# Patient Record
Sex: Female | Born: 1995
Health system: Southern US, Community
[De-identification: ages and names within clinical notes are randomized; demographics above are authoritative.]

## PROBLEM LIST (undated history)

## (undated) DIAGNOSIS — Z91010 Allergy to peanuts: Secondary | ICD-10-CM

## (undated) DIAGNOSIS — K219 Gastro-esophageal reflux disease without esophagitis: Secondary | ICD-10-CM

## (undated) DIAGNOSIS — L732 Hidradenitis suppurativa: Secondary | ICD-10-CM

## (undated) DIAGNOSIS — F419 Anxiety disorder, unspecified: Secondary | ICD-10-CM

## (undated) DIAGNOSIS — T7840XA Allergy, unspecified, initial encounter: Secondary | ICD-10-CM

## (undated) HISTORY — DX: Hidradenitis suppurativa: L73.2

## (undated) HISTORY — DX: Gastro-esophageal reflux disease without esophagitis: K21.9

## (undated) HISTORY — DX: Allergy, unspecified, initial encounter: T78.40XA

## (undated) HISTORY — DX: Allergy to peanuts: Z91.010

## (undated) HISTORY — DX: Anxiety disorder, unspecified: F41.9

---
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
## 2014-06-12 ENCOUNTER — Ambulatory Visit: Payer: Medicaid Other | Admitting: Pediatrics

## 2014-06-19 ENCOUNTER — Emergency Department (HOSPITAL_COMMUNITY)
Admission: EM | Admit: 2014-06-19 | Discharge: 2014-06-19 | Discharge status: Against Medical Advice | Payer: Medicaid Other | Attending: Emergency Medicine | Admitting: Emergency Medicine

## 2014-06-19 ENCOUNTER — Encounter (HOSPITAL_COMMUNITY): Payer: Self-pay | Admitting: Emergency Medicine

## 2014-06-19 DIAGNOSIS — R404 Transient alteration of awareness: Secondary | ICD-10-CM | POA: Insufficient documentation

## 2014-06-19 DIAGNOSIS — J45909 Unspecified asthma, uncomplicated: Secondary | ICD-10-CM | POA: Insufficient documentation

## 2014-06-19 NOTE — ED Notes (Signed)
Pt refused blood draw.  Triage tech will speak with triage nurse pertaining to pt's concern.

## 2014-06-19 NOTE — ED Notes (Signed)
Pt's family came to the desk looking for pt's discharge papers after speaking to pt informing her that she cannot be discharged until the EDP have evaluated her.  She states that she will take pt somewhere else.  Pt and family left the ED.

## 2014-06-19 NOTE — ED Notes (Signed)
Pt states she was at work and she passed out  Pt states she does not remember passing out but woke up on the ground  Pt states she has not eaten since 6am today  Pt states she used to do this when she was in high school but hasn't since then  Pt is not diabetic

## 2014-08-14 ENCOUNTER — Encounter: Payer: Self-pay | Admitting: Pediatrics

## 2014-08-14 ENCOUNTER — Ambulatory Visit (INDEPENDENT_AMBULATORY_CARE_PROVIDER_SITE_OTHER): Payer: Medicaid Other | Admitting: Pediatrics

## 2014-08-14 VITALS — BP 120/68 | Temp 99.4°F | Wt 166.6 lb

## 2014-08-14 DIAGNOSIS — Z3202 Encounter for pregnancy test, result negative: Secondary | ICD-10-CM

## 2014-08-14 DIAGNOSIS — Z23 Encounter for immunization: Secondary | ICD-10-CM

## 2014-08-14 DIAGNOSIS — Z3201 Encounter for pregnancy test, result positive: Secondary | ICD-10-CM

## 2014-08-14 DIAGNOSIS — N912 Amenorrhea, unspecified: Secondary | ICD-10-CM

## 2014-08-14 LAB — POCT URINE PREGNANCY: Preg Test, Ur: NEGATIVE

## 2014-08-14 NOTE — Progress Notes (Signed)
Patient states she has not had menstrual cycle since the beginning of September. She states she has had vomiting 2 times, she states that she took two clear blue that came out negative and 2 [employer] pregnancy tests that came back positive. She states she alternated within the past two weeks which test she performed. Patient states it has been a year since she had birthcontrol, she stopped it June/July of 2014.

## 2014-08-14 NOTE — Progress Notes (Signed)
Subjective:     Patient ID: Kerri Gomez, female   DOB: [birth year], 18 y.o.   MRN: [id number]  HPI     Kerri Gomez is here with a concern of pregnancy.  She reports her last menstrual period was some time the end of August or first of September.  She feels she has missed a period or two.  She last had sex sometime in August or September.  She has been nauseated off and on.  Last time she vomited was some time about two weeks ago.  She has done 4 pregnancy tests over the last two weeks with the first two from [facility] being positive and then two from a drugstore being negative.  Her breasts are not swollen. She is having no dysuria, abdominal pain, no vaginal discharge,  no cramping, and no vaginal bleeding since her last period some time towards the end of the summer. She uses no method of birth control.  Kerri Gomez is in the room by herself but reports she has discussed the possibility of pregnancy with her mother who is in the waiting room.  After the exam, explained to Kerri Gomez that the urine POC pregnancy test today was negative but with her amenorrhea we should proceed with a serum HCG as well as routine HIV, GC, and Chlamydia screening.  She has refused blood draws in the ED and left the ED on 06/19/14 without completing her evaluation for having passed out at work.  She reports that she does not want any blood drawn today so provider requested that she call her mother on her cell phone to come to the room.  Advised that blood draw and immunizations are highly recommended today.   Review of Systems  Constitutional: Negative for fever, chills, activity change and appetite change.  HENT: Negative for congestion, rhinorrhea and sore throat.   Respiratory: Negative for cough.   Gastrointestinal: Positive for vomiting. Negative for diarrhea and constipation.  Genitourinary: Negative for dysuria and frequency.  Neurological: Negative for light-headedness (no recent  episodes of fainting since seen in the ED 06/19/14).       Objective:   Physical Exam  Constitutional: She appears well-developed and well-nourished. No distress.  HENT:  Mouth/Throat: No oropharyngeal exudate.  Eyes: Conjunctivae are normal. Right eye exhibits no discharge. Left eye exhibits no discharge.  Neck: Neck supple. No thyromegaly present.  Cardiovascular: Normal rate.   No murmur heard. Pulmonary/Chest: Effort normal and breath sounds normal.  Abdominal: Soft. She exhibits no mass. There is no tenderness. There is no rebound and no guarding.  Belly appears a little full in the suprapubic area but cannot palpate enlarged uterus.   Musculoskeletal: She exhibits no edema.  Skin: No rash noted.       Assessment and Plan:   1. Amenorrhea  - POCT urine pregnancy negative in clinic today  2. Need for vaccination - left without getting vaccines  3. Pregnancy test negative - left without getting labs - HIV antibody - GC/chlamydia probe amp, urine - hCG, serum, qualitative  4. Pregnancy test positive by history - left without getting labs - HIV antibody - GC/chlamydia probe amp, urine - hCG, serum, qualitative  Teen seen leaving exam room, followed and caught up with her getting on the elevator with her mother.  Requested to return to the clinic for labs and but mother says, "everyone in this clinic is rude and my child does not have to have blood drawn if  she does not want to!"  Will proceed with testing urine for GC and chlamydia.  Shea EvansMelinda Coover Nashawn Hillock, MD [facility] for [facility], [address] [place], [place] [address] [phone]

## 2014-08-15 LAB — GC/CHLAMYDIA PROBE AMP, URINE
CHLAMYDIA, SWAB/URINE, PCR: NEGATIVE
GC PROBE AMP, URINE: NEGATIVE

## 2014-08-16 ENCOUNTER — Telehealth: Payer: Self-pay | Admitting: *Deleted

## 2014-08-16 LAB — HCG, SERUM, QUALITATIVE

## 2014-08-16 LAB — HIV ANTIBODY (ROUTINE TESTING W REFLEX)

## 2014-08-16 NOTE — Telephone Encounter (Signed)
Tonya from [facility] called to see of blood was drawn for patient, i told her that i would have Chasitie return her call

## 2014-09-12 ENCOUNTER — Telehealth: Payer: Self-pay | Admitting: *Deleted

## 2014-09-12 NOTE — Telephone Encounter (Signed)
Kerri Gomez called requesting to have a UPT done her at the office, she has never been seen here. I called and explained to her that UPT can be bought OTC, she tool 2 of them and both negative. She said no cylce in November, I explained to her that we can see as a new patient,but she cant just have a UPT done here. She said okay and hung up the phone.

## 2014-09-13 ENCOUNTER — Encounter (HOSPITAL_COMMUNITY): Payer: Self-pay | Admitting: *Deleted

## 2014-09-13 ENCOUNTER — Emergency Department (INDEPENDENT_AMBULATORY_CARE_PROVIDER_SITE_OTHER)
Admission: EM | Admit: 2014-09-13 | Discharge: 2014-09-13 | Disposition: A | Discharge status: Home / Self Care | Payer: Medicaid Other | Source: Home / Self Care | Attending: Emergency Medicine | Admitting: Emergency Medicine

## 2014-09-13 DIAGNOSIS — N926 Irregular menstruation, unspecified: Secondary | ICD-10-CM

## 2014-09-13 LAB — POCT URINALYSIS DIP (DEVICE)
BILIRUBIN URINE: NEGATIVE
GLUCOSE, UA: NEGATIVE mg/dL
KETONES UR: NEGATIVE mg/dL
NITRITE: NEGATIVE
Protein, ur: 30 mg/dL — AB
Specific Gravity, Urine: >=1.03 (ref 1.005–1.030)
Urobilinogen, UA: 0.2 mg/dL (ref 0.0–1.0)
pH: 5.5 (ref 5.0–8.0)

## 2014-09-13 LAB — HCG, QUANTITATIVE, PREGNANCY

## 2014-09-13 LAB — POCT PREGNANCY, URINE: PREG TEST UR: NEGATIVE

## 2014-09-13 NOTE — Discharge Instructions (Signed)
Abnormal Uterine Bleeding Abnormal uterine bleeding can affect women at various stages in life, including teenagers, women in their reproductive years, pregnant women, and women who have reached menopause. Several kinds of uterine bleeding are considered abnormal, including:  Bleeding or spotting between periods.   Bleeding after sexual intercourse.   Bleeding that is heavier or more than normal.   Periods that last longer than usual.  Bleeding after menopause.  Many cases of abnormal uterine bleeding are minor and simple to treat, while others are more serious. Any type of abnormal bleeding should be evaluated by your health care provider. Treatment will depend on the cause of the bleeding. HOME CARE INSTRUCTIONS Monitor your condition for any changes. The following actions may help to alleviate any discomfort you are experiencing:  Avoid the use of tampons and douches as directed by your health care provider.  Change your pads frequently. You should get regular pelvic exams and Pap tests. Keep all follow-up appointments for diagnostic tests as directed by your health care provider.  SEEK MEDICAL CARE IF:   Your bleeding lasts more than 1 week.   You feel dizzy at times.  SEEK IMMEDIATE MEDICAL CARE IF:   You pass out.   You are changing pads every 15 to 30 minutes.   You have abdominal pain.  You have a fever.   You become sweaty or weak.   You are passing large blood clots from the vagina.   You start to feel nauseous and vomit. MAKE SURE YOU:   Understand these instructions.  Will watch your condition.  Will get help right away if you are not doing well or get worse. Document Released: 09/29/2005 Document Revised: 10/04/2013 Document Reviewed: 04/28/2013 ExitCare Patient Information 2015 [employer]. This information is not intended to replace advice given to you by your health care provider. Make sure you discuss any questions you have with your  health care provider.  

## 2014-09-13 NOTE — ED Notes (Signed)
Last normal  menstural  Period        approx  2  Months  Ago   - has  Had   Some  Spotting  As    Well    Spotted  Some  Several  Days  Ago        pos  Home  preg test      - ambulated  Well  With a  Steady  Fluid  Gait

## 2014-09-13 NOTE — ED Provider Notes (Signed)
CSN: [id number]     Arrival date & time 09/13/14  1618 History   First MD Initiated Contact with Patient 09/13/14 1636     Chief Complaint  Patient presents with  . Possible Pregnancy   (Consider location/radiation/quality/duration/timing/severity/associated sxs/prior Treatment) HPI     18 year old female presents requesting a pregnancy test. She is currently trying to get pregnant. She did a pregnancy test at home that was +2 weeks ago, she did 10 other pregnancy tests that were negative. She denies any abdominal pain, vaginal bleeding, nausea, vomiting, fever, chills. She has had spotting for the past year.  Past Medical History  Diagnosis Date  . Asthma   . Hidradenitis suppurativa   . Peanut allergy    History reviewed. No pertinent past surgical history. Family History  Problem Relation Age of Onset  . Diabetes Mother   . Heart disease Mother     hole in her heart  . Diabetes Maternal Grandmother   . Diabetes Maternal Grandfather    History  Substance Use Topics  . Smoking status: Never Smoker   . Smokeless tobacco: Never Used  . Alcohol Use: No   OB History    No data available     Review of Systems  Gastrointestinal: Negative for nausea, vomiting and abdominal pain.  Genitourinary: Positive for vaginal bleeding (spotting).  All other systems reviewed and are negative.   Allergies  Peanut-containing drug products and Latex  Home Medications   Prior to Admission medications   Medication Sig Start Date End Date Taking? Authorizing Provider  acetaminophen (TYLENOL) 500 MG tablet Take 1,000 mg by mouth every 6 (six) hours as needed for pain.    Historical Provider, MD  albuterol (PROVENTIL HFA;VENTOLIN HFA) 108 (90 BASE) MCG/ACT inhaler Inhale 2 puffs into the lungs every 4 (four) hours as needed for wheezing or shortness of breath.     Historical Provider, MD  clindamycin (CLEOCIN-T) 1 % external solution Apply topically 2 (two) times daily. 09/30/13   Heber CarolinaKate S  Ettefagh, MD  diphenhydrAMINE (BENADRYL) 25 mg capsule Take 25 mg by mouth every 6 (six) hours as needed for itching or allergies.    Historical Provider, MD  EPINEPHrine (EPIPEN) 0.3 mg/0.3 mL SOAJ injection Inject 0.3 mLs (0.3 mg total) into the muscle once. As needed for anaphylaxis.  Call 911 or go directly to the ER if used. 09/30/13   Heber CarolinaKate S Ettefagh, MD   BP 131/85 mmHg  Pulse 90  Temp(Src) 98.7 F (37.1 C) (Oral)  Resp 16  SpO2 94%  LMP 07/13/2014 (Approximate) Physical Exam  Constitutional: She is oriented to person, place, and time. Vital signs are normal. She appears well-developed and well-nourished. No distress.  HENT:  Head: Normocephalic and atraumatic.  Cardiovascular: Normal rate, regular rhythm and normal heart sounds.   Pulmonary/Chest: Effort normal and breath sounds normal. No respiratory distress.  Abdominal: Soft. She exhibits distension (Mildly distended. No palpable uterus). She exhibits no mass. There is no tenderness. There is no rebound and no guarding.  Neurological: She is alert and oriented to person, place, and time. She has normal strength. Coordination normal.  Skin: Skin is warm and dry. No rash noted. She is not diaphoretic.  Psychiatric: She has a normal mood and affect. Judgment normal.  Nursing note and vitals reviewed.   ED Course  Procedures (including critical care time) Labs Review Labs Reviewed  POCT URINALYSIS DIP (DEVICE) - Abnormal; Notable for the following:    Hgb urine dipstick LARGE (*)  Protein, ur 30 (*)    Leukocytes, UA TRACE (*)    All other components within normal limits  POCT PREGNANCY, URINE    Imaging Review No results found.   MDM   1. Irregular periods    HCG Quant sent to confirm negative pregnancy. She confirms no abdominal pain or vaginal bleeding right now, she just wants to have a urine pregnancy test. Follow-up when necessary       Graylon GoodZachary H Shadavia Dampier, PA-C 09/13/14 1702

## 2015-05-06 ENCOUNTER — Emergency Department (HOSPITAL_COMMUNITY)
Admission: EM | Admit: 2015-05-06 | Discharge: 2015-05-06 | Disposition: A | Discharge status: Home / Self Care | Payer: Medicaid Other | Attending: Emergency Medicine | Admitting: Emergency Medicine

## 2015-05-06 ENCOUNTER — Encounter (HOSPITAL_COMMUNITY): Payer: Self-pay | Admitting: *Deleted

## 2015-05-06 DIAGNOSIS — Z872 Personal history of diseases of the skin and subcutaneous tissue: Secondary | ICD-10-CM | POA: Diagnosis not present

## 2015-05-06 DIAGNOSIS — Z792 Long term (current) use of antibiotics: Secondary | ICD-10-CM | POA: Insufficient documentation

## 2015-05-06 DIAGNOSIS — R55 Syncope and collapse: Secondary | ICD-10-CM | POA: Insufficient documentation

## 2015-05-06 DIAGNOSIS — Z91040 Latex allergy status: Secondary | ICD-10-CM | POA: Insufficient documentation

## 2015-05-06 DIAGNOSIS — J45909 Unspecified asthma, uncomplicated: Secondary | ICD-10-CM | POA: Diagnosis not present

## 2015-05-06 DIAGNOSIS — Z3202 Encounter for pregnancy test, result negative: Secondary | ICD-10-CM | POA: Diagnosis not present

## 2015-05-06 DIAGNOSIS — Z79899 Other long term (current) drug therapy: Secondary | ICD-10-CM | POA: Insufficient documentation

## 2015-05-06 LAB — URINALYSIS, ROUTINE W REFLEX MICROSCOPIC
Bilirubin Urine: NEGATIVE
Glucose, UA: NEGATIVE mg/dL
Hgb urine dipstick: NEGATIVE
KETONES UR: NEGATIVE mg/dL
Nitrite: NEGATIVE
PH: 5.5 (ref 5.0–8.0)
Protein, ur: NEGATIVE mg/dL
SPECIFIC GRAVITY, URINE: 1.028 (ref 1.005–1.030)
Urobilinogen, UA: 0.2 mg/dL (ref 0.0–1.0)

## 2015-05-06 LAB — URINE MICROSCOPIC-ADD ON

## 2015-05-06 LAB — CBG MONITORING, ED: GLUCOSE-CAPILLARY: 69 mg/dL (ref 65–99)

## 2015-05-06 LAB — POC URINE PREG, ED: Preg Test, Ur: NEGATIVE

## 2015-05-06 MED ORDER — ACETAMINOPHEN 325 MG PO TABS
650.0000 mg | ORAL_TABLET | Freq: Once | ORAL | Status: AC
  Administered 2015-05-06: 650 mg via ORAL
  Filled 2015-05-06: qty 2
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
## 2015-05-06 NOTE — ED Notes (Signed)
Made nurse aware
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
## 2015-05-06 NOTE — ED Notes (Signed)
Patient is refusing labs and IV, MD notified.

## 2015-05-06 NOTE — ED Notes (Signed)
Patient refused to have blood collect 

## 2015-08-29 ENCOUNTER — Emergency Department (HOSPITAL_COMMUNITY)
Admission: EM | Admit: 2015-08-29 | Discharge: 2015-08-29 | Disposition: A | Discharge status: Home / Self Care | Payer: No Typology Code available for payment source | Attending: Emergency Medicine | Admitting: Emergency Medicine

## 2015-08-29 ENCOUNTER — Encounter (HOSPITAL_COMMUNITY): Payer: Self-pay | Admitting: Emergency Medicine

## 2015-08-29 ENCOUNTER — Emergency Department (HOSPITAL_COMMUNITY): Payer: No Typology Code available for payment source

## 2015-08-29 DIAGNOSIS — S29001A Unspecified injury of muscle and tendon of front wall of thorax, initial encounter: Secondary | ICD-10-CM | POA: Diagnosis not present

## 2015-08-29 DIAGNOSIS — Z91040 Latex allergy status: Secondary | ICD-10-CM | POA: Insufficient documentation

## 2015-08-29 DIAGNOSIS — Z872 Personal history of diseases of the skin and subcutaneous tissue: Secondary | ICD-10-CM | POA: Diagnosis not present

## 2015-08-29 DIAGNOSIS — S299XXA Unspecified injury of thorax, initial encounter: Secondary | ICD-10-CM | POA: Diagnosis present

## 2015-08-29 DIAGNOSIS — Y92410 Unspecified street and highway as the place of occurrence of the external cause: Secondary | ICD-10-CM | POA: Insufficient documentation

## 2015-08-29 DIAGNOSIS — Y9389 Activity, other specified: Secondary | ICD-10-CM | POA: Diagnosis not present

## 2015-08-29 DIAGNOSIS — Z79899 Other long term (current) drug therapy: Secondary | ICD-10-CM | POA: Insufficient documentation

## 2015-08-29 DIAGNOSIS — Y998 Other external cause status: Secondary | ICD-10-CM | POA: Diagnosis not present

## 2015-08-29 DIAGNOSIS — J45909 Unspecified asthma, uncomplicated: Secondary | ICD-10-CM | POA: Insufficient documentation

## 2015-08-29 DIAGNOSIS — R0789 Other chest pain: Secondary | ICD-10-CM

## 2015-08-29 MED ORDER — METHOCARBAMOL 500 MG PO TABS: 500.0000 mg | ORAL_TABLET | Freq: Two times a day (BID) | ORAL | Status: DC

## 2015-08-29 MED ORDER — HYDROCODONE-ACETAMINOPHEN 5-325 MG PO TABS
2.0000 | ORAL_TABLET | Freq: Once | ORAL | Status: AC
  Administered 2015-08-29: 2 via ORAL
  Filled 2015-08-29: qty 2

## 2015-08-29 MED ORDER — IBUPROFEN 400 MG PO TABS: 400.0000 mg | ORAL_TABLET | Freq: Four times a day (QID) | ORAL | Status: DC | PRN

## 2015-08-29 MED ORDER — ONDANSETRON 4 MG PO TBDP: 4.0000 mg | ORAL_TABLET | Freq: Once | ORAL | Status: DC
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
## 2015-08-29 NOTE — ED Notes (Signed)
Acuity level changed to 3 at PA request.
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
## 2015-12-05 ENCOUNTER — Ambulatory Visit: Payer: Medicaid Other | Admitting: Allergy and Immunology

## 2015-12-06 ENCOUNTER — Ambulatory Visit (INDEPENDENT_AMBULATORY_CARE_PROVIDER_SITE_OTHER): Payer: Medicaid Other | Admitting: Allergy and Immunology

## 2015-12-06 VITALS — BP 104/64 | HR 107 | Temp 98.1°F | Resp 20 | Ht 66.14 in | Wt 195.7 lb

## 2015-12-06 DIAGNOSIS — H1010 Acute atopic conjunctivitis, unspecified eye: Secondary | ICD-10-CM | POA: Diagnosis not present

## 2015-12-06 DIAGNOSIS — R05 Cough: Secondary | ICD-10-CM | POA: Diagnosis not present

## 2015-12-06 DIAGNOSIS — J309 Allergic rhinitis, unspecified: Secondary | ICD-10-CM

## 2015-12-06 DIAGNOSIS — R059 Cough, unspecified: Secondary | ICD-10-CM

## 2015-12-06 MED ORDER — LORATADINE 10 MG PO TABS: 10.0000 mg | ORAL_TABLET | Freq: Every day | ORAL | Status: DC

## 2015-12-06 MED ORDER — OLOPATADINE HCL 0.2 % OP SOLN: 1.0000 [drp] | OPHTHALMIC | Status: DC

## 2015-12-06 MED ORDER — FLUTICASONE PROPIONATE 50 MCG/ACT NA SUSP: NASAL | Status: DC

## 2015-12-06 MED ORDER — ALBUTEROL SULFATE HFA 108 (90 BASE) MCG/ACT IN AERS: 2.0000 | INHALATION_SPRAY | RESPIRATORY_TRACT | Status: DC | PRN
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
## 2015-12-06 NOTE — Patient Instructions (Signed)
   Flonase one-two sprays once each nostril once daily.  Claritin  once daily.  Epi-pen/benadryl as needed.  ProAir 2 puffs every 4 hours as needed.  Call with any recurring ProAir use.  Pataday one drop each eye once daily.  Keep well hydrated and avoid irritants/cig smoke/strong perfumes.  Schedule for repeat allergy testing appointment for peanut otherwise follow-up in 6 months or sooner if needed.

## 2015-12-27 ENCOUNTER — Encounter: Payer: Self-pay | Admitting: Allergy and Immunology

## 2015-12-27 ENCOUNTER — Ambulatory Visit (INDEPENDENT_AMBULATORY_CARE_PROVIDER_SITE_OTHER): Payer: Medicaid Other | Admitting: Allergy and Immunology

## 2015-12-27 VITALS — BP 122/80 | HR 76 | Temp 98.1°F | Resp 16

## 2015-12-27 DIAGNOSIS — J309 Allergic rhinitis, unspecified: Secondary | ICD-10-CM | POA: Diagnosis not present

## 2015-12-27 DIAGNOSIS — R05 Cough: Secondary | ICD-10-CM

## 2015-12-27 DIAGNOSIS — R059 Cough, unspecified: Secondary | ICD-10-CM

## 2015-12-27 DIAGNOSIS — Z91010 Allergy to peanuts: Secondary | ICD-10-CM | POA: Diagnosis not present

## 2015-12-27 DIAGNOSIS — H1010 Acute atopic conjunctivitis, unspecified eye: Secondary | ICD-10-CM | POA: Diagnosis not present

## 2015-12-27 MED ORDER — LEVOCETIRIZINE DIHYDROCHLORIDE 5 MG PO TABS: ORAL_TABLET | ORAL | Status: DC

## 2015-12-27 MED ORDER — MONTELUKAST SODIUM 10 MG PO TABS: ORAL_TABLET | ORAL | Status: DC

## 2015-12-27 MED ORDER — FLUTICASONE PROPIONATE 50 MCG/ACT NA SUSP: NASAL | Status: DC

## 2015-12-27 MED ORDER — EPINEPHRINE 0.3 MG/0.3ML IJ SOAJ: INTRAMUSCULAR | Status: DC

## 2015-12-27 MED ORDER — ALBUTEROL SULFATE HFA 108 (90 BASE) MCG/ACT IN AERS: INHALATION_SPRAY | RESPIRATORY_TRACT | Status: DC

## 2015-12-27 NOTE — Progress Notes (Signed)
FOLLOW UP NOTE  RE: Kerri RocaZibria S Degraaf MRN: [id number] DOB: 03/09/1996 [facility] [place] [address] [place] [id number] Date of Office Visit: 12/27/2015  Subjective:  Kerri Gomez is a 20 y.o. female who presents today for Allergy Testing and Medication Management  Assessment:   1. Allergic rhinoconjunctivitis.   2. Cough, currently asymptomatic.   3. Question of activity-induced dyspnea due to de-conditioning versus bronchospasm.   4.      Peanut/Tree nut allergy--decreased skin test hyperreactivity.  Suspected lessening hypersensitivity. Plan:   Meds ordered this encounter  Medications  . levocetirizine (XYZAL) 5 MG tablet    Sig: Take one tablet daily for runny nose or itching.    Dispense:  34 tablet    Refill:  5  . montelukast (SINGULAIR) 10 MG tablet    Sig: Take one tablet each evening to prevent cough or wheeze.    Dispense:  34 tablet    Refill:  5  . fluticasone (FLONASE) 50 MCG/ACT nasal spray    Sig: Use 1-2 sprays in each nostril once daily for stuffy nose or drainage    Dispense:  16 g    Refill:  5  . albuterol (PROVENTIL HFA;VENTOLIN HFA) 108 (90 Base) MCG/ACT inhaler    Sig: Use 2 puffs every 4 hours as needed for cough or wheeze.  May use 2 puffs 10-20 minutes prior to exercise.    Dispense:  1 Inhaler    Refill:  1  . EPINEPHrine 0.3 mg/0.3 mL IJ SOAJ injection    Sig: Use as directed for a severe allergic reaction.    Dispense:  4 Device    Refill:  2   Patient Instructions  1.  Begin trial Xyzal 5 mg daily and stop Claritin. 2.  Begin Singulair 10 mg each evening. 3.  Flonase 1-2 sprays each nostril once daily as needed for congestion and continue Pataday once daily as needed. 4.  Consider allergy injections--- written information given today. 5.  Allergen avoidance Information given and reviewed today. 6.  EpiPen, Benadryl as needed. 7.  [facility]--- paperwork given for nut  panel specific IgE. 8.  Consider in office peanut butter challenge. 9.  Ventolin HFA as needed and monitor closely for recurring use.  And assess need for further evaluation or management. 10. Return to primary care physician for full physical and establish activity/exercise program.   HPI: Kerri Gomez returns to the office with her mother off antihistamines for selected repeat allergen testing, given her peanut/nut allergy history.  Generally she has been feeling well since her February visit, without cough, wheeze, shortness of breath, difficulty breathing, new medical concerns itching or rashes.  She did use her albuterol once while at work for shortness of breath , without cough or wheeze and did not think it was helpful, but did not have persisting symptoms when she relaxed.  No other concerns. Denies ED or urgent care visits, prednisone or antibiotic courses. Reports sleep and activity are normal.  Kerri Gomez has a current medication list which includes the following prescription(s): albuterol, diphenhydramine, epinephrine, fluticasone, ibuprofen, loratadine, olopatadine opth hcl.   Drug Allergies: Allergies  Allergen Reactions  . Lactose Intolerance (Gi) Nausea And Vomiting  . Peanut-Containing Drug Products   . Latex Rash   Objective:   Filed Vitals:   12/27/15 1524  BP: 122/80  Pulse: 76  Temp: 98.1 F (36.7 C)  Resp: 16   Physical Exam  Constitutional:  She is well-developed, well-nourished, and in no distress.  HENT:  Head: Atraumatic.  Right Ear: Tympanic membrane and ear canal normal.  Left Ear: Tympanic membrane and ear canal normal.  Nose: Mucosal edema present. No rhinorrhea. No epistaxis.  Mouth/Throat: Oropharynx is clear and moist and mucous membranes are normal. No oropharyngeal exudate, posterior oropharyngeal edema or posterior oropharyngeal erythema.  Neck: Neck supple.  Cardiovascular: Normal rate, S1 normal and S2 normal.   No murmur heard. Pulmonary/Chest: Effort  normal. She has no wheezes. She has no rhonchi. She has no rales.  Lymphadenopathy:    She has no cervical adenopathy.   Diagnostics: Spirometry:  FVC 3.48--105%, FEV1 2.99---105%. Skin test:  Mild reactivity to selected mold species, hazelnut and coconut with equivocal reactivity to peanut, pecan, walnut, almond and [place] nut.  (In Review 2013 testing= peanut--2+, almond/hazelnut--3+ and very strong reactivity to grass, weed and tree pollens, dust mite and cat).    Kerri Gomez M. Willa Rough, MD  cc: Pola Corn, MD

## 2015-12-27 NOTE — Patient Instructions (Addendum)
   Begin trial Xyzal 5 mg daily.  Begin Singulair 10 mg each evening.  Flonase 1-2 sprays each nostril once daily as needed for congestion.  Consider allergy injections--- written information given today.  Allergen avoidance Information.  EpiPen, Benadryl as needed.  [facility].  Consider in office peanut butter challenge.  Ventolin HFA as needed.

## 2016-01-17 ENCOUNTER — Encounter: Payer: Medicaid Other | Admitting: Allergy and Immunology

## 2016-01-21 ENCOUNTER — Telehealth: Payer: Self-pay | Admitting: Allergy and Immunology

## 2016-01-21 ENCOUNTER — Other Ambulatory Visit: Payer: Self-pay

## 2016-01-21 MED ORDER — ALBUTEROL SULFATE (2.5 MG/3ML) 0.083% IN NEBU: 2.5000 mg | INHALATION_SOLUTION | RESPIRATORY_TRACT | Status: DC | PRN

## 2016-01-21 NOTE — Telephone Encounter (Signed)
Sent in refill

## 2016-01-21 NOTE — Telephone Encounter (Signed)
Pt went to [facility] on [address] to pick up rx and they are not there and she needs the albuterol for the nebulizer.

## 2016-07-07 ENCOUNTER — Telehealth: Payer: Self-pay | Admitting: Allergy and Immunology

## 2016-07-07 NOTE — Telephone Encounter (Signed)
She needs to know if it was a flu shot or a TB test that she had done earlier this year. She needs this information for her job today if at all possible.

## 2016-07-07 NOTE — Telephone Encounter (Signed)
I do not see where any injections have been given. I tried contacting pt but number was not working will try again number later.

## 2016-09-05 ENCOUNTER — Encounter (HOSPITAL_COMMUNITY): Payer: Self-pay | Admitting: Emergency Medicine

## 2016-09-05 ENCOUNTER — Emergency Department (HOSPITAL_COMMUNITY)
Admission: EM | Admit: 2016-09-05 | Discharge: 2016-09-05 | Disposition: A | Discharge status: Home / Self Care | Payer: Medicaid Other | Attending: Emergency Medicine | Admitting: Emergency Medicine

## 2016-09-05 DIAGNOSIS — Y939 Activity, unspecified: Secondary | ICD-10-CM | POA: Insufficient documentation

## 2016-09-05 DIAGNOSIS — Z91040 Latex allergy status: Secondary | ICD-10-CM | POA: Insufficient documentation

## 2016-09-05 DIAGNOSIS — T2101XA Burn of unspecified degree of chest wall, initial encounter: Secondary | ICD-10-CM | POA: Diagnosis not present

## 2016-09-05 DIAGNOSIS — Y929 Unspecified place or not applicable: Secondary | ICD-10-CM | POA: Diagnosis not present

## 2016-09-05 DIAGNOSIS — F17290 Nicotine dependence, other tobacco product, uncomplicated: Secondary | ICD-10-CM | POA: Insufficient documentation

## 2016-09-05 DIAGNOSIS — T300 Burn of unspecified body region, unspecified degree: Secondary | ICD-10-CM

## 2016-09-05 DIAGNOSIS — X118XXA Contact with other hot tap-water, initial encounter: Secondary | ICD-10-CM | POA: Insufficient documentation

## 2016-09-05 DIAGNOSIS — Y990 Civilian activity done for income or pay: Secondary | ICD-10-CM | POA: Diagnosis not present

## 2016-09-05 DIAGNOSIS — Z91010 Allergy to peanuts: Secondary | ICD-10-CM | POA: Diagnosis not present

## 2016-09-05 DIAGNOSIS — J45909 Unspecified asthma, uncomplicated: Secondary | ICD-10-CM | POA: Diagnosis not present

## 2016-09-05 NOTE — ED Triage Notes (Signed)
Pt at work with complaint of burn to left nipple. Burn as result of washing dishes and container with water approx 180 degrees F made contact with complaint area.

## 2016-09-05 NOTE — Discharge Instructions (Signed)
Please read attached information. If you experience any new or worsening signs or symptoms please return to the emergency room for evaluation. Please follow-up with your primary care provider or specialist as discussed.  °

## 2016-09-05 NOTE — ED Provider Notes (Signed)
[facility]-EMERGENCY DEPT Provider Note   CSN: [id number] Arrival date & time: 09/05/16  1441  By signing my name below, I, Soijett Blue, attest that this documentation has been prepared under the direction and in the presence of Burna FortsJeff Raymie Trani, PA-C Electronically Signed: Soijett Blue, ED Scribe. 09/05/16. 3:35 PM.   History   Chief Complaint Chief Complaint  Patient presents with  . Burn    HPI  Kerri Gomez is a 20 y.o. female who presents to the Emergency Department complaining of burn onset 1 hour ago PTA. Pt reports that she was washing dishes while at work when the hot water that was in a container splashed onto her left sided chest and nipple area when she went to move the container. She states that she is having associated symptoms of redness to left sided chest and nipple area. Pt hasn't tried any medications or cool compresses for the relief of her symptoms.  She denies fever, chills, drainage, color change, and any other symptoms.    The history is provided by the patient. No language interpreter was used.    Past Medical History:  Diagnosis Date  . Asthma   . Hidradenitis suppurativa   . Peanut allergy     Patient Active Problem List   Diagnosis Date Noted  . Peanut allergy 09/30/2013  . Hidradenitis suppurativa 09/30/2013    History reviewed. No pertinent surgical history.  OB History    No data available       Home Medications    Prior to Admission medications   Medication Sig Start Date End Date Taking? Authorizing Provider  albuterol (PROVENTIL HFA;VENTOLIN HFA) 108 (90 Base) MCG/ACT inhaler Use 2 puffs every 4 hours as needed for cough or wheeze.  May use 2 puffs 10-20 minutes prior to exercise. 12/27/15   Roselyn Kara MeadM Hicks, MD  albuterol (PROVENTIL) (2.5 MG/3ML) 0.083% nebulizer solution Take 3 mLs (2.5 mg total) by nebulization every 4 (four) hours as needed for wheezing or shortness of breath. 01/21/16   Roselyn Kara MeadM Hicks, MD  clindamycin (CLEOCIN-T) 1 %  external solution Apply topically 2 (two) times daily. Patient not taking: Reported on 05/06/2015 09/30/13   Voncille LoKate Ettefagh, MD  diphenhydrAMINE (BENADRYL) 25 mg capsule Take 25 mg by mouth every 6 (six) hours as needed for itching or allergies.    Historical Provider, MD  EPINEPHrine 0.3 mg/0.3 mL IJ SOAJ injection Use as directed for a severe allergic reaction. 12/27/15   Roselyn Kara MeadM Hicks, MD  fluticasone (FLONASE) 50 MCG/ACT nasal spray Use 1-2 sprays in each nostril once daily for stuffy nose or drainage 12/27/15   Roselyn Kara MeadM Hicks, MD  ibuprofen (ADVIL,MOTRIN) 400 MG tablet Take 1 tablet (400 mg total) by mouth every 6 (six) hours as needed. 08/29/15   Joycie PeekBenjamin Cartner, PA-C  levocetirizine (XYZAL) 5 MG tablet Take one tablet daily for runny nose or itching. 12/27/15   Roselyn Kara MeadM Hicks, MD  loratadine (CLARITIN) 10 MG tablet Take 1 tablet (10 mg total) by mouth daily. 12/06/15   Roselyn Kara MeadM Hicks, MD  methocarbamol (ROBAXIN) 500 MG tablet Take 1 tablet (500 mg total) by mouth 2 (two) times daily. Patient not taking: Reported on 12/27/2015 08/29/15   Joycie PeekBenjamin Cartner, PA-C  montelukast (SINGULAIR) 10 MG tablet Take one tablet each evening to prevent cough or wheeze. 12/27/15   Roselyn Kara MeadM Hicks, MD  Olopatadine HCl (PATADAY) 0.2 % SOLN Place 1 drop into both eyes 1 day or 1 dose. 12/06/15   Roselyn Kara MeadM Hicks,  MD    Family History Family History  Problem Relation Age of Onset  . Diabetes Mother   . Heart disease Mother     hole in her heart  . Diabetes Maternal Grandmother   . Diabetes Maternal Grandfather     Social History Social History  Substance Use Topics  . Smoking status: Current Some Day Smoker    Types: Cigars  . Smokeless tobacco: Never Used  . Alcohol use No     Allergies   Lactose intolerance (gi); Peanut-containing drug products; and Latex   Review of Systems Review of Systems  Constitutional: Negative for chills and fever.  Skin: Positive for color change (redness to left  sided chest and nipple area). Negative for wound.       Burn to left sided chest and nipple without drainage     Physical Exam Updated Vital Signs BP 124/71 (BP Location: Left Arm)   Pulse 83   Temp 98 F (36.7 C) (Oral)   Resp 17   LMP 07/28/2016   SpO2 100%   Physical Exam  Constitutional: She is oriented to person, place, and time. She appears well-developed and well-nourished. No distress.  HENT:  Head: Normocephalic and atraumatic.  Eyes: EOM are normal.  Neck: Neck supple.  Cardiovascular: Normal rate.   Pulmonary/Chest: Effort normal. No respiratory distress.  Abdominal: She exhibits no distension.  Musculoskeletal: Normal range of motion.  Neurological: She is alert and oriented to person, place, and time.  Skin: Skin is warm and dry.  Very minor redness over left chest and nipple. No signs of deep burn or blistering.   Psychiatric: She has a normal mood and affect. Her behavior is normal.  Nursing note and vitals reviewed.   ED Treatments / Results  DIAGNOSTIC STUDIES: Oxygen Saturation is 97% on RA, nl by my interpretation.    COORDINATION OF CARE: 3:28 PM Discussed treatment plan with pt at bedside which includes cool compresses and pt agreed to plan.   Procedures Procedures (including critical care time)  Medications Ordered in ED Medications - No data to display   Initial Impression / Assessment and Plan / ED Course  I have reviewed the triage vital signs and the nursing notes.  Clinical Course    Labs:   Imaging:   Consults:   Therapeutics: Cool compresses  Discharge Meds:   Assessment/Plan: Pt presents s/p burn to left chest and nipple area, this appears to be superficial. No sign of infection or blisters. Afebrile. No signs of systemic illness. Continue wound care as directed. F/u if signs of infection present. Given strict return precautions in the event new or worsening symptoms present. Pt verbalized understanding and agreement to  today's plan and had no further questions or concerns at this time.    Final Clinical Impressions(s) / ED Diagnoses   Final diagnoses:  Burn    New Prescriptions Discharge Medication List as of 09/05/2016  3:50 PM      I personally performed the services described in this documentation, which was scribed in my presence. The recorded information has been reviewed and is accurate.    Eyvonne MechanicJeffrey Rowyn Mustapha, PA-C 09/05/16 1726    Arby BarretteMarcy Pfeiffer, MD 09/08/16 [phone]

## 2016-10-01 ENCOUNTER — Encounter (HOSPITAL_COMMUNITY): Payer: Self-pay | Admitting: Emergency Medicine

## 2016-10-01 ENCOUNTER — Emergency Department (HOSPITAL_COMMUNITY)
Admission: EM | Admit: 2016-10-01 | Discharge: 2016-10-01 | Disposition: A | Discharge status: Home / Self Care | Payer: Medicaid Other | Attending: Emergency Medicine | Admitting: Emergency Medicine

## 2016-10-01 DIAGNOSIS — J45909 Unspecified asthma, uncomplicated: Secondary | ICD-10-CM | POA: Insufficient documentation

## 2016-10-01 DIAGNOSIS — F17290 Nicotine dependence, other tobacco product, uncomplicated: Secondary | ICD-10-CM | POA: Insufficient documentation

## 2016-10-01 DIAGNOSIS — Z5321 Procedure and treatment not carried out due to patient leaving prior to being seen by health care provider: Secondary | ICD-10-CM | POA: Insufficient documentation

## 2016-10-01 DIAGNOSIS — J029 Acute pharyngitis, unspecified: Secondary | ICD-10-CM | POA: Insufficient documentation

## 2016-10-01 LAB — RAPID STREP SCREEN (MED CTR MEBANE ONLY): STREPTOCOCCUS, GROUP A SCREEN (DIRECT): NEGATIVE

## 2016-10-01 NOTE — ED Notes (Signed)
Attempted to call x3 with no response

## 2016-10-01 NOTE — ED Triage Notes (Signed)
Pt states she lost her voice yesterday, has a sore throat and is having some chest congestion.

## 2016-10-03 ENCOUNTER — Emergency Department (HOSPITAL_COMMUNITY): Payer: Self-pay

## 2016-10-03 ENCOUNTER — Encounter (HOSPITAL_COMMUNITY): Payer: Self-pay | Admitting: Emergency Medicine

## 2016-10-03 ENCOUNTER — Emergency Department (HOSPITAL_COMMUNITY)
Admission: EM | Admit: 2016-10-03 | Discharge: 2016-10-03 | Disposition: A | Discharge status: Home / Self Care | Payer: Self-pay | Attending: Emergency Medicine | Admitting: Emergency Medicine

## 2016-10-03 DIAGNOSIS — F17290 Nicotine dependence, other tobacco product, uncomplicated: Secondary | ICD-10-CM | POA: Insufficient documentation

## 2016-10-03 DIAGNOSIS — Z91040 Latex allergy status: Secondary | ICD-10-CM | POA: Insufficient documentation

## 2016-10-03 DIAGNOSIS — J45909 Unspecified asthma, uncomplicated: Secondary | ICD-10-CM | POA: Insufficient documentation

## 2016-10-03 DIAGNOSIS — Z79899 Other long term (current) drug therapy: Secondary | ICD-10-CM | POA: Insufficient documentation

## 2016-10-03 DIAGNOSIS — Z91010 Allergy to peanuts: Secondary | ICD-10-CM | POA: Insufficient documentation

## 2016-10-03 DIAGNOSIS — J111 Influenza due to unidentified influenza virus with other respiratory manifestations: Secondary | ICD-10-CM | POA: Insufficient documentation

## 2016-10-03 DIAGNOSIS — R69 Illness, unspecified: Secondary | ICD-10-CM

## 2016-10-03 LAB — POC URINE PREG, ED: Preg Test, Ur: NEGATIVE

## 2016-10-03 MED ORDER — GUAIFENESIN ER 1200 MG PO TB12: 1.0000 | ORAL_TABLET | Freq: Two times a day (BID) | ORAL | 0 refills | Status: DC

## 2016-10-03 MED ORDER — IBUPROFEN 800 MG PO TABS: 800.0000 mg | ORAL_TABLET | Freq: Three times a day (TID) | ORAL | 0 refills | Status: DC | PRN

## 2016-10-03 MED ORDER — PROMETHAZINE-DM 6.25-15 MG/5ML PO SYRP: 5.0000 mL | ORAL_SOLUTION | Freq: Four times a day (QID) | ORAL | 0 refills | Status: DC | PRN

## 2016-10-03 MED ORDER — SODIUM CHLORIDE 0.9 % IV BOLUS (SEPSIS): 1000.0000 mL | Freq: Once | INTRAVENOUS | Status: DC

## 2016-10-03 MED FILL — PROMETHAZINE-DM SYRUP: 6.25-15 | 6 days supply | Qty: 120 | Fill #0

## 2016-10-03 NOTE — Discharge Instructions (Signed)
Return here as needed.  Follow-up with your primary care Dr. increase your fluid intake, rest as much possible

## 2016-10-03 NOTE — ED Notes (Signed)
Patient aware that a urine sample is needed. Patient will notify staff when able to provide.  

## 2016-10-03 NOTE — ED Notes (Signed)
Patient still stating they are unable to provide sample at this time.

## 2016-10-03 NOTE — ED Triage Notes (Signed)
Pt reports sore throat and voice hoarseness for the past few days along with productive cough. Pt also started having entire torso pain accompanied by emesis for the past 2 days.

## 2016-10-03 NOTE — ED Notes (Signed)
Pt states she would like to be seen by the provider before being stuck for blood work or IV.

## 2016-10-04 LAB — CULTURE, GROUP A STREP (THRC)

## 2016-10-05 NOTE — ED Provider Notes (Signed)
[facility] Note   CSN: [id number] Arrival date & time: 10/03/16  4098     History   Chief Complaint Chief Complaint  Patient presents with  . Emesis  . Sore Throat  . Abdominal Pain    HPI Kerri Gomez is a 20 y.o. female.  HPI Patient presents to the emergency department with sore throat, cough, nasal congestion, body aches, vomiting and nausea over the last 4 days.  The patient states that she was seen yesterday and had a negative strep screen.  Patient states that nothing seems make the condition better or worseThe patient denies chest pain, shortness of breath, headache,blurred vision, neck pain,  weakness, numbness, dizziness, anorexia, edema, abdominal pain diarrhea, rash, back pain, dysuria, hematemesis, bloody stool, near syncope, or syncope. Past Medical History:  Diagnosis Date  . Asthma   . Hidradenitis suppurativa   . Peanut allergy     Patient Active Problem List   Diagnosis Date Noted  . Peanut allergy 09/30/2013  . Hidradenitis suppurativa 09/30/2013    History reviewed. No pertinent surgical history.  OB History    No data available       Home Medications    Prior to Admission medications   Medication Sig Start Date End Date Taking? Authorizing Provider  albuterol (PROVENTIL HFA;VENTOLIN HFA) 108 (90 Base) MCG/ACT inhaler Use 2 puffs every 4 hours as needed for cough or wheeze.  May use 2 puffs 10-20 minutes prior to exercise. 12/27/15  Yes Roselyn Kara Mead, MD  albuterol (PROVENTIL) (2.5 MG/3ML) 0.083% nebulizer solution Take 3 mLs (2.5 mg total) by nebulization every 4 (four) hours as needed for wheezing or shortness of breath. 01/21/16  Yes Roselyn Kara Mead, MD  diphenhydrAMINE (BENADRYL) 25 mg capsule Take 25 mg by mouth every 6 (six) hours as needed for itching or allergies.   Yes Historical Provider, MD  EPINEPHrine 0.3 mg/0.3 mL IJ SOAJ injection Use as directed for a severe allergic reaction. Patient taking  differently: Inject 0.3 mg into the skin once as needed (allergic reaction). Use as directed for a severe allergic reaction. 12/27/15  Yes Roselyn Kara Mead, MD  fluticasone (FLONASE) 50 MCG/ACT nasal spray Use 1-2 sprays in each nostril once daily for stuffy nose or drainage Patient taking differently: Place 1-2 sprays into both nostrils daily as needed for allergies.  12/27/15  Yes Roselyn Kara Mead, MD  levocetirizine (XYZAL) 5 MG tablet Take one tablet daily for runny nose or itching. Patient taking differently: Take 5 mg by mouth daily as needed for allergies.  12/27/15  Yes Roselyn Kara Mead, MD  loratadine (CLARITIN) 10 MG tablet Take 1 tablet (10 mg total) by mouth daily. Patient taking differently: Take 10 mg by mouth daily as needed for allergies.  12/06/15  Yes Roselyn Kara Mead, MD  Olopatadine HCl (PATADAY) 0.2 % SOLN Place 1 drop into both eyes 1 day or 1 dose. Patient taking differently: Place 1 drop into both eyes daily as needed (allergies).  12/06/15  Yes Roselyn Kara Mead, MD  clindamycin (CLEOCIN-T) 1 % external solution Apply topically 2 (two) times daily. Patient not taking: Reported on 05/06/2015 09/30/13   Voncille Lo, MD  Guaifenesin 1200 MG TB12 Take 1 tablet (1,200 mg total) by mouth 2 (two) times daily. 10/03/16   Charlestine Night, PA-C  ibuprofen (ADVIL,MOTRIN) 800 MG tablet Take 1 tablet (800 mg total) by mouth every 8 (eight) hours as needed. 10/03/16   Milayna Rotenberg, PA-C  montelukast (SINGULAIR) 10  MG tablet Take one tablet each evening to prevent cough or wheeze. Patient not taking: Reported on 10/03/2016 12/27/15   Baxter Hireoselyn M Hicks, MD  promethazine-dextromethorphan (PROMETHAZINE-DM) 6.25-15 MG/5ML syrup Take 5 mLs by mouth 4 (four) times daily as needed for cough. 10/03/16   Charlestine Nighthristopher Mickelle Goupil, PA-C    Family History Family History  Problem Relation Age of Onset  . Diabetes Mother   . Heart disease Mother     hole in her heart  . Diabetes Maternal Grandmother   .  Diabetes Maternal Grandfather     Social History Social History  Substance Use Topics  . Smoking status: Current Some Day Smoker    Types: Cigars  . Smokeless tobacco: Never Used  . Alcohol use No     Allergies   Lactose intolerance (gi); Peanut-containing drug products; and Latex   Review of Systems Review of Systems All other systems negative except as documented in the HPI. All pertinent positives and negatives as reviewed in the HPI.  Physical Exam Updated Vital Signs BP 125/71   Pulse 97   Temp 98.5 F (36.9 C) (Oral)   Resp 17   LMP 09/21/2016   SpO2 100%   Physical Exam  Constitutional: She is oriented to person, place, and time. She appears well-developed and well-nourished. No distress.  HENT:  Head: Normocephalic and atraumatic.  Mouth/Throat: Oropharynx is clear and moist.  Eyes: Pupils are equal, round, and reactive to light.  Neck: Normal range of motion. Neck supple.  Cardiovascular: Normal rate, regular rhythm and normal heart sounds.  Exam reveals no gallop and no friction rub.   No murmur heard. Pulmonary/Chest: Effort normal and breath sounds normal. No respiratory distress. She has no wheezes.  Abdominal: Soft. Bowel sounds are normal. She exhibits no distension. There is no tenderness.  Neurological: She is alert and oriented to person, place, and time. She exhibits normal muscle tone. Coordination normal.  Skin: Skin is warm and dry. No rash noted. No erythema.  Psychiatric: She has a normal mood and affect. Her behavior is normal.  Nursing note and vitals reviewed.    ED Treatments / Results  Labs (all labs ordered are listed, but only abnormal results are displayed) Labs Reviewed  POC URINE PREG, ED    EKG  EKG Interpretation None       Radiology No results found.  Procedures Procedures (including critical care time)  Medications Ordered in ED Medications - No data to display   Initial Impression / Assessment and Plan /  ED Course  I have reviewed the triage vital signs and the nursing notes.  Pertinent labs & imaging results that were available during my care of the patient were reviewed by me and considered in my medical decision making (see chart for details).  Clinical Course     Patient be treated for influenza-like illness.  Told to return here as needed.  Patient agrees the plan and all questions were answered  Final Clinical Impressions(s) / ED Diagnoses   Final diagnoses:  Influenza-like illness    New Prescriptions Discharge Medication List as of 10/03/2016 12:25 PM    START taking these medications   Details  Guaifenesin 1200 MG TB12 Take 1 tablet (1,200 mg total) by mouth 2 (two) times daily., Starting Fri 10/03/2016, Print    ibuprofen (ADVIL,MOTRIN) 800 MG tablet Take 1 tablet (800 mg total) by mouth every 8 (eight) hours as needed., Starting Fri 10/03/2016, Print    promethazine-dextromethorphan (PROMETHAZINE-DM) 6.25-15 MG/5ML syrup Take  5 mLs by mouth 4 (four) times daily as needed for cough., Starting Fri 10/03/2016, Print         Charlestine Nighthristopher Berklie Dethlefs, PA-C 10/05/16 1537    Linwood DibblesJon Knapp, MD 10/05/16 [phone]

## 2016-11-28 ENCOUNTER — Emergency Department (HOSPITAL_COMMUNITY)
Admission: EM | Admit: 2016-11-28 | Discharge: 2016-11-29 | Disposition: A | Discharge status: Home / Self Care | Payer: Medicaid Other | Attending: Emergency Medicine | Admitting: Emergency Medicine

## 2016-11-28 ENCOUNTER — Encounter (HOSPITAL_COMMUNITY): Payer: Self-pay

## 2016-11-28 DIAGNOSIS — R112 Nausea with vomiting, unspecified: Secondary | ICD-10-CM | POA: Insufficient documentation

## 2016-11-28 DIAGNOSIS — F17290 Nicotine dependence, other tobacco product, uncomplicated: Secondary | ICD-10-CM | POA: Insufficient documentation

## 2016-11-28 DIAGNOSIS — Z91040 Latex allergy status: Secondary | ICD-10-CM | POA: Insufficient documentation

## 2016-11-28 DIAGNOSIS — J45909 Unspecified asthma, uncomplicated: Secondary | ICD-10-CM | POA: Insufficient documentation

## 2016-11-28 DIAGNOSIS — Z79899 Other long term (current) drug therapy: Secondary | ICD-10-CM | POA: Insufficient documentation

## 2016-11-28 DIAGNOSIS — Z91010 Allergy to peanuts: Secondary | ICD-10-CM | POA: Insufficient documentation

## 2016-11-28 LAB — CBC
HEMATOCRIT: 39.5 % (ref 36.0–46.0)
HEMOGLOBIN: 13 g/dL (ref 12.0–15.0)
MCH: 30 pg (ref 26.0–34.0)
MCHC: 32.9 g/dL (ref 30.0–36.0)
MCV: 91 fL (ref 78.0–100.0)
Platelets: 274 10*3/uL (ref 150–400)
RBC: 4.34 MIL/uL (ref 3.87–5.11)
RDW: 12.5 % (ref 11.5–15.5)
WBC: 6.5 10*3/uL (ref 4.0–10.5)

## 2016-11-28 LAB — COMPREHENSIVE METABOLIC PANEL
ALBUMIN: 4.8 g/dL (ref 3.5–5.0)
ALT: 16 U/L (ref 14–54)
ANION GAP: 9 (ref 5–15)
AST: 20 U/L (ref 15–41)
Alkaline Phosphatase: 47 U/L (ref 38–126)
BUN: 8 mg/dL (ref 6–20)
CO2: 22 mmol/L (ref 22–32)
Calcium: 9.6 mg/dL (ref 8.9–10.3)
Chloride: 107 mmol/L (ref 101–111)
Creatinine, Ser: 0.71 mg/dL (ref 0.44–1.00)
GFR calc non Af Amer: >60 mL/min (ref >60)
GLUCOSE: 105 mg/dL — AB (ref 65–99)
POTASSIUM: 3.8 mmol/L (ref 3.5–5.1)
SODIUM: 138 mmol/L (ref 135–145)
Total Bilirubin: 0.8 mg/dL (ref 0.3–1.2)
Total Protein: 8.8 g/dL — ABNORMAL HIGH (ref 6.5–8.1)

## 2016-11-28 LAB — URINALYSIS, ROUTINE W REFLEX MICROSCOPIC
Bacteria, UA: NONE SEEN
Bilirubin Urine: NEGATIVE
GLUCOSE, UA: NEGATIVE mg/dL
KETONES UR: 5 mg/dL — AB
NITRITE: NEGATIVE
PROTEIN: NEGATIVE mg/dL
Specific Gravity, Urine: 1.03 (ref 1.005–1.030)
pH: 5 (ref 5.0–8.0)

## 2016-11-28 LAB — LIPASE, BLOOD: LIPASE: 21 U/L (ref 11–51)

## 2016-11-28 LAB — POC URINE PREG, ED: PREG TEST UR: NEGATIVE

## 2016-11-28 MED ORDER — ONDANSETRON 8 MG PO TBDP
8.0000 mg | ORAL_TABLET | Freq: Once | ORAL | Status: AC
  Administered 2016-11-29: 8 mg via ORAL
  Filled 2016-11-28: qty 1

## 2016-11-28 NOTE — ED Provider Notes (Signed)
[facility]-EMERGENCY DEPT Provider Note   CSN: [id number] Arrival date & time: 11/28/16  1812  By signing my name below, I, Linna Darner, attest that this documentation has been prepared under the direction and in the presence of physician practitioner, Dione Booze, MD. Electronically Signed: Linna Darner, Scribe. 11/28/2016. 11:30 PM.  History   Chief Complaint Chief Complaint  Patient presents with  . Nausea    The history is provided by the patient. No language interpreter was used.     HPI Comments: Kerri Gomez is a 21 y.o. female who presents to the Emergency Department complaining of persistent nausea since yesterday. She states she vomited once yesterday after eating and has not vomited since. Pt reports associated subjective fever/chills and occasional diaphoresis. No known sick contacts. She is currently menstruating. She denies myalgias, diarrhea, or any other associated symptoms.  PCP: Dr. Shana Chute  Past Medical History:  Diagnosis Date  . Asthma   . Hidradenitis suppurativa   . Peanut allergy     Patient Active Problem List   Diagnosis Date Noted  . Peanut allergy 09/30/2013  . Hidradenitis suppurativa 09/30/2013    History reviewed. No pertinent surgical history.  OB History    No data available       Home Medications    Prior to Admission medications   Medication Sig Start Date End Date Taking? Authorizing Provider  albuterol (PROVENTIL HFA;VENTOLIN HFA) 108 (90 Base) MCG/ACT inhaler Use 2 puffs every 4 hours as needed for cough or wheeze.  May use 2 puffs 10-20 minutes prior to exercise. 12/27/15   Roselyn Kara Mead, MD  albuterol (PROVENTIL) (2.5 MG/3ML) 0.083% nebulizer solution Take 3 mLs (2.5 mg total) by nebulization every 4 (four) hours as needed for wheezing or shortness of breath. 01/21/16   Roselyn Kara Mead, MD  clindamycin (CLEOCIN-T) 1 % external solution Apply topically 2 (two) times daily. Patient not taking: Reported on 05/06/2015 09/30/13    Voncille Lo, MD  diphenhydrAMINE (BENADRYL) 25 mg capsule Take 25 mg by mouth every 6 (six) hours as needed for itching or allergies.    Historical Provider, MD  EPINEPHrine 0.3 mg/0.3 mL IJ SOAJ injection Use as directed for a severe allergic reaction. Patient taking differently: Inject 0.3 mg into the skin once as needed (allergic reaction). Use as directed for a severe allergic reaction. 12/27/15   Roselyn Kara Mead, MD  fluticasone (FLONASE) 50 MCG/ACT nasal spray Use 1-2 sprays in each nostril once daily for stuffy nose or drainage Patient taking differently: Place 1-2 sprays into both nostrils daily as needed for allergies.  12/27/15   Roselyn Kara Mead, MD  Guaifenesin 1200 MG TB12 Take 1 tablet (1,200 mg total) by mouth 2 (two) times daily. 10/03/16   Charlestine Night, PA-C  ibuprofen (ADVIL,MOTRIN) 800 MG tablet Take 1 tablet (800 mg total) by mouth every 8 (eight) hours as needed. 10/03/16   Charlestine Night, PA-C  levocetirizine (XYZAL) 5 MG tablet Take one tablet daily for runny nose or itching. Patient taking differently: Take 5 mg by mouth daily as needed for allergies.  12/27/15   Roselyn Kara Mead, MD  loratadine (CLARITIN) 10 MG tablet Take 1 tablet (10 mg total) by mouth daily. Patient taking differently: Take 10 mg by mouth daily as needed for allergies.  12/06/15   Roselyn Kara Mead, MD  montelukast (SINGULAIR) 10 MG tablet Take one tablet each evening to prevent cough or wheeze. Patient not taking: Reported on 10/03/2016 12/27/15   Roselyn Kara Mead,  MD  Olopatadine HCl (PATADAY) 0.2 % SOLN Place 1 drop into both eyes 1 day or 1 dose. Patient taking differently: Place 1 drop into both eyes daily as needed (allergies).  12/06/15   Roselyn Kara Mead, MD  promethazine-dextromethorphan (PROMETHAZINE-DM) 6.25-15 MG/5ML syrup Take 5 mLs by mouth 4 (four) times daily as needed for cough. 10/03/16   Charlestine Night, PA-C    Family History Family History  Problem Relation Age of Onset  .  Diabetes Mother   . Heart disease Mother     hole in her heart  . Diabetes Maternal Grandmother   . Diabetes Maternal Grandfather     Social History Social History  Substance Use Topics  . Smoking status: Current Some Day Smoker    Types: Cigars  . Smokeless tobacco: Never Used  . Alcohol use No     Allergies   Lactose intolerance (gi); Peanut-containing drug products; and Latex   Review of Systems Review of Systems  Constitutional: Positive for chills, diaphoresis and fever.  Gastrointestinal: Positive for nausea and vomiting. Negative for diarrhea.  Musculoskeletal: Negative for myalgias.  All other systems reviewed and are negative.    Physical Exam Updated Vital Signs BP 151/84 (BP Location: Left Arm)   Pulse 102   Temp 99 F (37.2 C) (Oral)   Resp 18   Wt 194 lb (88 kg)   SpO2 100%   BMI 31.18 kg/m   Physical Exam  Constitutional: She is oriented to person, place, and time. She appears well-developed and well-nourished.  HENT:  Head: Normocephalic and atraumatic.  Mild tenderness over maxillary and frontal sinuses.  Eyes: EOM are normal. Pupils are equal, round, and reactive to light.  Neck: Normal range of motion. Neck supple. No JVD present.  Cardiovascular: Normal rate, regular rhythm and normal heart sounds.   No murmur heard. Pulmonary/Chest: Effort normal and breath sounds normal. She has no wheezes. She has no rales. She exhibits no tenderness.  Abdominal: Soft. She exhibits no distension and no mass. Bowel sounds are decreased. There is no tenderness.  Musculoskeletal: Normal range of motion. She exhibits no edema.  Lymphadenopathy:    She has no cervical adenopathy.  Neurological: She is alert and oriented to person, place, and time. No cranial nerve deficit. She exhibits normal muscle tone. Coordination normal.  Skin: Skin is warm and dry. No rash noted.  Psychiatric: She has a normal mood and affect. Her behavior is normal. Judgment and  thought content normal.  Nursing note and vitals reviewed.    ED Treatments / Results  Labs (all labs ordered are listed, but only abnormal results are displayed) Labs Reviewed  COMPREHENSIVE METABOLIC PANEL - Abnormal; Notable for the following:       Result Value   Glucose, Bld 105 (*)    Total Protein 8.8 (*)    All other components within normal limits  URINALYSIS, ROUTINE W REFLEX MICROSCOPIC - Abnormal; Notable for the following:    APPearance HAZY (*)    Hgb urine dipstick LARGE (*)    Ketones, ur 5 (*)    Leukocytes, UA TRACE (*)    Squamous Epithelial / LPF 0-5 (*)    All other components within normal limits  LIPASE, BLOOD  CBC  POC URINE PREG, ED     Procedures Procedures (including critical care time)  DIAGNOSTIC STUDIES: Oxygen Saturation is 100% on RA, normal by my interpretation.    COORDINATION OF CARE: 11:35 PM Discussed treatment plan with pt at bedside  and pt agreed to plan.  Medications Ordered in ED Medications  ondansetron (ZOFRAN-ODT) disintegrating tablet 8 mg (not administered)     Initial Impression / Assessment and Plan / ED Course  I have reviewed the triage vital signs and the nursing notes.  Pertinent lab results that were available during my care of the patient were reviewed by me and considered in my medical decision making (see chart for details).   nausea with vomiting in pattern strongly suggestive of viral gastritis. Doubt food poisoning. No red flags to suggest more serious illness. Laboratory workup is unremarkable. Old records reviewed, and she has no relevant past visits. She is discharged with prescription for ondansetron.  Final Clinical Impressions(s) / ED Diagnoses   Final diagnoses:  Non-intractable vomiting with nausea, unspecified vomiting type    New Prescriptions New Prescriptions   ONDANSETRON (ZOFRAN) 4 MG TABLET    Take 1 tablet (4 mg total) by mouth every 6 (six) hours as needed for nausea.   I personally  performed the services described in this documentation, which was scribed in my presence. The recorded information has been reviewed and is accurate.      Dione Boozeavid Venisa Frampton, MD 11/29/16 Moses [id number]

## 2016-11-28 NOTE — ED Notes (Signed)
Pt called for blood draw, no response 

## 2016-11-28 NOTE — ED Triage Notes (Signed)
Pt states that she has been experiencing nausea since yesterday and is unable to keep anything down. A&Ox4. Denies diarrhea. Endorses lower abdominal pain. Denies urinary symptoms.

## 2016-11-29 MED ORDER — ONDANSETRON HCL 4 MG PO TABS: 4.0000 mg | ORAL_TABLET | Freq: Four times a day (QID) | ORAL | 0 refills | Status: DC | PRN

## 2017-01-26 ENCOUNTER — Ambulatory Visit (INDEPENDENT_AMBULATORY_CARE_PROVIDER_SITE_OTHER): Payer: Self-pay | Admitting: Physician Assistant

## 2017-05-25 ENCOUNTER — Ambulatory Visit (INDEPENDENT_AMBULATORY_CARE_PROVIDER_SITE_OTHER): Payer: Self-pay | Admitting: Physician Assistant

## 2017-12-26 ENCOUNTER — Emergency Department (HOSPITAL_COMMUNITY)
Admission: EM | Admit: 2017-12-26 | Discharge: 2017-12-26 | Disposition: A | Discharge status: Home / Self Care | Payer: Self-pay | Attending: Physician Assistant | Admitting: Physician Assistant

## 2017-12-26 ENCOUNTER — Encounter (HOSPITAL_COMMUNITY): Payer: Self-pay

## 2017-12-26 DIAGNOSIS — R112 Nausea with vomiting, unspecified: Secondary | ICD-10-CM | POA: Insufficient documentation

## 2017-12-26 DIAGNOSIS — Z91010 Allergy to peanuts: Secondary | ICD-10-CM | POA: Insufficient documentation

## 2017-12-26 DIAGNOSIS — F17290 Nicotine dependence, other tobacco product, uncomplicated: Secondary | ICD-10-CM | POA: Insufficient documentation

## 2017-12-26 DIAGNOSIS — R197 Diarrhea, unspecified: Secondary | ICD-10-CM | POA: Insufficient documentation

## 2017-12-26 DIAGNOSIS — Z91040 Latex allergy status: Secondary | ICD-10-CM | POA: Insufficient documentation

## 2017-12-26 DIAGNOSIS — J45909 Unspecified asthma, uncomplicated: Secondary | ICD-10-CM | POA: Insufficient documentation

## 2017-12-26 LAB — COMPREHENSIVE METABOLIC PANEL
ALBUMIN: 4.4 g/dL (ref 3.5–5.0)
ALK PHOS: 43 U/L (ref 38–126)
ALT: 17 U/L (ref 14–54)
AST: 26 U/L (ref 15–41)
Anion gap: 13 (ref 5–15)
BILIRUBIN TOTAL: 1 mg/dL (ref 0.3–1.2)
BUN: 8 mg/dL (ref 6–20)
CALCIUM: 9.3 mg/dL (ref 8.9–10.3)
CO2: 20 mmol/L — ABNORMAL LOW (ref 22–32)
CREATININE: 0.84 mg/dL (ref 0.44–1.00)
Chloride: 103 mmol/L (ref 101–111)
GFR calc Af Amer: >60 mL/min (ref >60)
GFR calc non Af Amer: >60 mL/min (ref >60)
GLUCOSE: 169 mg/dL — AB (ref 65–99)
POTASSIUM: 4 mmol/L (ref 3.5–5.1)
Sodium: 136 mmol/L (ref 135–145)
TOTAL PROTEIN: 7.9 g/dL (ref 6.5–8.1)

## 2017-12-26 LAB — CBC
HEMATOCRIT: 36.8 % (ref 36.0–46.0)
Hemoglobin: 12.2 g/dL (ref 12.0–15.0)
MCH: 31.1 pg (ref 26.0–34.0)
MCHC: 33.2 g/dL (ref 30.0–36.0)
MCV: 93.9 fL (ref 78.0–100.0)
Platelets: 281 10*3/uL (ref 150–400)
RBC: 3.92 MIL/uL (ref 3.87–5.11)
RDW: 12.7 % (ref 11.5–15.5)
WBC: 6.7 10*3/uL (ref 4.0–10.5)

## 2017-12-26 LAB — I-STAT BETA HCG BLOOD, ED (MC, WL, AP ONLY): I-stat hCG, quantitative: <5 m[IU]/mL (ref <5)

## 2017-12-26 LAB — LIPASE, BLOOD: Lipase: 23 U/L (ref 11–51)

## 2017-12-26 MED ORDER — PROMETHAZINE HCL 25 MG/ML IJ SOLN
25.0000 mg | Freq: Once | INTRAMUSCULAR | Status: AC
  Administered 2017-12-26: 25 mg via INTRAMUSCULAR
  Filled 2017-12-26: qty 1

## 2017-12-26 MED ORDER — ONDANSETRON 4 MG PO TBDP
4.0000 mg | ORAL_TABLET | Freq: Once | ORAL | Status: AC | PRN
  Administered 2017-12-26: 4 mg via ORAL
  Filled 2017-12-26: qty 1

## 2017-12-26 MED ORDER — PROMETHAZINE HCL 25 MG PO TABS: 25.0000 mg | ORAL_TABLET | Freq: Three times a day (TID) | ORAL | 0 refills | Status: DC | PRN

## 2017-12-26 MED ORDER — ONDANSETRON HCL 4 MG PO TABS: 4.0000 mg | ORAL_TABLET | Freq: Three times a day (TID) | ORAL | 0 refills | Status: DC | PRN

## 2017-12-26 MED ORDER — ONDANSETRON HCL 4 MG PO TABS
4.0000 mg | ORAL_TABLET | Freq: Once | ORAL | Status: AC
  Administered 2017-12-26: 4 mg via ORAL
  Filled 2017-12-26: qty 1

## 2017-12-26 MED ORDER — ACETAMINOPHEN 500 MG PO TABS
1000.0000 mg | ORAL_TABLET | Freq: Once | ORAL | Status: AC
  Administered 2017-12-26: 1000 mg via ORAL
  Filled 2017-12-26: qty 2

## 2017-12-26 MED ORDER — PROMETHAZINE HCL 25 MG PO TABS: 25.0000 mg | ORAL_TABLET | Freq: Once | ORAL | Status: DC

## 2017-12-26 NOTE — ED Triage Notes (Signed)
PT reports she is unable to void at this time

## 2017-12-26 NOTE — ED Notes (Signed)
Vomited x 1 folliowng zofran ODT

## 2017-12-26 NOTE — Discharge Instructions (Addendum)
Use Zofran and/or Phenergan as needed for nausea or vomiting. Make sure you are staying hydrated with water. You may use Tylenol as needed for pain. Avoid acidic, spicy, fatty, or greasy foods until your symptoms have completely resolved. Make sure you wash your hands frequently to prevent spread of infection.  You may follow-up with your primary care doctor if your symptoms are not improving in 5 days. Return to the emergency room if you develop worsening pain, your abdomen become swollen and hard, you are unable to tolerate fluids, or any new or concerning symptoms.

## 2017-12-26 NOTE — ED Triage Notes (Signed)
PT returned to room from bath room with out assistance.

## 2017-12-26 NOTE — ED Triage Notes (Signed)
PT up to BR with diarrhea and vomiting.

## 2017-12-26 NOTE — ED Triage Notes (Signed)
Patient complains of generalized abdominal pain with vomiting and diarrhea since 0300. Describes the discomfort as nausea and cramping. Alert and oriented

## 2017-12-26 NOTE — ED Provider Notes (Addendum)
[facility]   CSN: [id number] Arrival date & time: 12/26/17  [id number]     History   Chief Complaint Chief Complaint  Patient presents with  . Abdominal Pain  . Emesis    HPI Lisa RocaZibria S Sunderland is a 22 y.o. female presenting for evaluation of nausea, vomiting, diarrhea.  Patient states that at 3:00 this morning, she had acute onset nausea, vomiting, diarrhea.  She has associated generalized abdominal pain.  She reports she was feeling well yesterday and had no symptoms.  She has had more than 10 episodes of vomiting and diarrhea so far today.  They are nonbloody.  She denies fevers, chills, chest pain, shortness of breath, or urinary symptoms.  She has no history of abdominal problems including surgeries, IBS, IBD.  She has not tried anything for her symptoms.  Nothing makes it better, nothing makes it worse.  She denies recent travel or new foods. She smokes marijuana occasionally, smoked last night. She denies other medical problems, does not take medications daily.   HPI  Past Medical History:  Diagnosis Date  . Asthma   . Hidradenitis suppurativa   . Peanut allergy     Patient Active Problem List   Diagnosis Date Noted  . Peanut allergy 09/30/2013  . Hidradenitis suppurativa 09/30/2013    History reviewed. No pertinent surgical history.  OB History    No data available       Home Medications    Prior to Admission medications   Medication Sig Start Date End Date Taking? Authorizing Provider  albuterol (PROVENTIL HFA;VENTOLIN HFA) 108 (90 Base) MCG/ACT inhaler Use 2 puffs every 4 hours as needed for cough or wheeze.  May use 2 puffs 10-20 minutes prior to exercise. 12/27/15   Baxter HireHicks, Roselyn M, MD  albuterol (PROVENTIL) (2.5 MG/3ML) 0.083% nebulizer solution Take 3 mLs (2.5 mg total) by nebulization every 4 (four) hours as needed for wheezing or shortness of breath. 01/21/16   Baxter HireHicks, Roselyn M, MD  EPINEPHrine 0.3 mg/0.3  mL IJ SOAJ injection Use as directed for a severe allergic reaction. Patient taking differently: Inject 0.3 mg into the skin once as needed (allergic reaction). Use as directed for a severe allergic reaction. 12/27/15   Baxter HireHicks, Roselyn M, MD  fluticasone (FLONASE) 50 MCG/ACT nasal spray Use 1-2 sprays in each nostril once daily for stuffy nose or drainage Patient taking differently: Place 1-2 sprays into both nostrils daily as needed for allergies.  12/27/15   Baxter HireHicks, Roselyn M, MD  ibuprofen (ADVIL,MOTRIN) 200 MG tablet Take 600 mg by mouth every 6 (six) hours as needed for moderate pain.    [provider]  levocetirizine (XYZAL) 5 MG tablet Take one tablet daily for runny nose or itching. Patient taking differently: Take 5 mg by mouth daily as needed for allergies.  12/27/15   Baxter HireHicks, Roselyn M, MD  loratadine (CLARITIN) 10 MG tablet Take 1 tablet (10 mg total) by mouth daily. Patient taking differently: Take 10 mg by mouth daily as needed for allergies.  12/06/15   Baxter HireHicks, Roselyn M, MD  montelukast (SINGULAIR) 10 MG tablet Take one tablet each evening to prevent cough or wheeze. 12/27/15   Baxter HireHicks, Roselyn M, MD  Olopatadine HCl (PATADAY) 0.2 % SOLN Place 1 drop into both eyes 1 day or 1 dose. Patient taking differently: Place 1 drop into both eyes daily as needed (allergies).  12/06/15   Baxter HireHicks, Roselyn M, MD  ondansetron (ZOFRAN) 4 MG tablet Take  1 tablet (4 mg total) by mouth every 8 (eight) hours as needed for nausea or vomiting. 12/26/17   Alixandrea Milleson, PA-C  promethazine (PHENERGAN) 25 MG tablet Take 1 tablet (25 mg total) by mouth every 8 (eight) hours as needed for nausea or vomiting. 12/26/17   Marquite Attwood, PA-C    Family History Family History  Problem Relation Age of Onset  . Diabetes Mother   . Heart disease Mother        hole in her heart  . Diabetes Maternal Grandmother   . Diabetes Maternal Grandfather     Social History Social History   Tobacco Use  . Smoking  status: Current Some Day Smoker    Types: Cigars  . Smokeless tobacco: Never Used  Substance Use Topics  . Alcohol use: No  . Drug use: No     Allergies   Lactose intolerance (gi); Peanut-containing drug products; and Latex   Review of Systems Review of Systems  Gastrointestinal: Positive for abdominal pain, diarrhea, nausea and vomiting.  All other systems reviewed and are negative.    Physical Exam Updated Vital Signs BP (!) 124/95 (BP Location: Left Arm)   Pulse 61   Temp 99.2 F (37.3 C) (Oral)   Resp 16   SpO2 99%   Physical Exam  Constitutional: She is oriented to person, place, and time. She appears well-developed and well-nourished. No distress.  HENT:  Head: Normocephalic and atraumatic.  Eyes: Conjunctivae and EOM are normal. Pupils are equal, round, and reactive to light.  Neck: Normal range of motion. Neck supple.  Cardiovascular: Normal rate, regular rhythm and intact distal pulses.  Pulmonary/Chest: Effort normal and breath sounds normal. No respiratory distress. She has no wheezes.  Abdominal: Soft. Bowel sounds are normal. She exhibits no distension and no mass. There is tenderness. There is no guarding.  Generalized TTP of the abd without rigidity or distention. Bowel sounds normoactive x4  Musculoskeletal: Normal range of motion.  Neurological: She is alert and oriented to person, place, and time.  Skin: Skin is warm and dry.  Psychiatric: She has a normal mood and affect.  Nursing note and vitals reviewed.    ED Treatments / Results  Labs (all labs ordered are listed, but only abnormal results are displayed) Labs Reviewed  COMPREHENSIVE METABOLIC PANEL - Abnormal; Notable for the following components:      Result Value   CO2 20 (*)    Glucose, Bld 169 (*)    All other components within normal limits  LIPASE, BLOOD  CBC  I-STAT BETA HCG BLOOD, ED ([facility], [facility], [facility] ONLY)    EKG  EKG Interpretation None       Radiology No results  found.  Procedures Procedures (including critical care time)  Medications Ordered in ED Medications  ondansetron (ZOFRAN-ODT) disintegrating tablet 4 mg (4 mg Oral Given 12/26/17 0718)  promethazine (PHENERGAN) injection 25 mg (25 mg Intramuscular Given 12/26/17 0911)  acetaminophen (TYLENOL) tablet 1,000 mg (1,000 mg Oral Given 12/26/17 1020)  ondansetron (ZOFRAN) tablet 4 mg (4 mg Oral Given 12/26/17 0957)     Initial Impression / Assessment and Plan / ED Course  I have reviewed the triage vital signs and the nursing notes.  Pertinent labs & imaging results that were available during my care of the patient were reviewed by me and considered in my medical decision making (see chart for details).     Patient presenting for evaluation of acute onset nausea, vomiting, and diarrhea.  Physical  exam reassuring, patient is afebrile not tachycardic.  Initial labs are without leukocytosis or significant electrolyte abnormality.  Creatinine stable.  Urine pending.  Patient states she threw up the sublingual Zofran immediately after it was placed.  She reports continued nausea, vomiting, diarrhea.  Will give IM Phenergan, as patient does not want an IV.  At this time, doubt intra-abdominal infection, obstruction, perforation.  I do not believe CT scan will be helpful at this time.  Likely gastroenteritis considering acute onset of vomiting and diarrhea.  On reassessment, pt reports nausea and pain have improved with phenergan, but are not resolved. Will give PO zofran and tylenol for further sx control. Pt encouraged to sip water.   Pt reports continued improvement of sxs. Will dc home with zofran and phenergan. Encouraged hydration. Pt to f/u with primary care as needed. At this time, pt appears safe for d/c. Return precautions given. Pt states she understands and agrees to plan.   Final Clinical Impressions(s) / ED Diagnoses   Final diagnoses:  Nausea vomiting and diarrhea    ED Discharge Orders         Ordered    ondansetron (ZOFRAN) 4 MG tablet  Every 8 hours PRN     12/26/17 1033    promethazine (PHENERGAN) 25 MG tablet  Every 8 hours PRN     12/26/17 1033       Devyne Hauger, PA-C 12/26/17 1639    Varsha Knock, PA-C 12/26/17 1640    Mackuen, Cindee Salt, MD 12/27/17 1205

## 2019-07-07 ENCOUNTER — Ambulatory Visit (HOSPITAL_COMMUNITY)
Admission: EM | Admit: 2019-07-07 | Discharge: 2019-07-07 | Disposition: A | Discharge status: Home / Self Care | Payer: No Typology Code available for payment source | Attending: Emergency Medicine | Admitting: Emergency Medicine

## 2019-07-07 ENCOUNTER — Ambulatory Visit (INDEPENDENT_AMBULATORY_CARE_PROVIDER_SITE_OTHER): Payer: No Typology Code available for payment source

## 2019-07-07 ENCOUNTER — Other Ambulatory Visit: Payer: Self-pay

## 2019-07-07 ENCOUNTER — Encounter (HOSPITAL_COMMUNITY): Payer: Self-pay | Admitting: Emergency Medicine

## 2019-07-07 DIAGNOSIS — S93402A Sprain of unspecified ligament of left ankle, initial encounter: Secondary | ICD-10-CM | POA: Diagnosis not present

## 2019-07-07 DIAGNOSIS — X500XXA Overexertion from strenuous movement or load, initial encounter: Secondary | ICD-10-CM | POA: Diagnosis not present

## 2019-07-07 MED ORDER — IBUPROFEN 600 MG PO TABS: 600.0000 mg | ORAL_TABLET | Freq: Four times a day (QID) | ORAL | 0 refills | Status: DC | PRN

## 2019-07-07 NOTE — Discharge Instructions (Signed)
No Fracture Weight bear as tolerated Ice and elevate 2-3 times a day Ease back into running with ankle brace Use anti-inflammatories for pain/swelling. You may take up to 600 mg Ibuprofen every 8 hours with food. You may supplement Ibuprofen with Tylenol [phone] mg every 8 hours.   Follow up if not improving

## 2019-07-07 NOTE — ED Triage Notes (Addendum)
Pt here for left ankle pain after injuring at work today

## 2019-07-07 NOTE — ED Provider Notes (Signed)
[facility]    CSN: [id number] Arrival date & time: 07/07/19  0626      History   Chief Complaint Chief Complaint  Patient presents with  . Ankle Pain    HPI Kerri Gomez is a 23 y.o. female history of asthma, presenting today for evaluation of left ankle injury.  This morning when she got off work she was stepping out of the car and got her foot caught in between the tire and a cement block.  She was wearing steel toed boots at that time.  Ankle twisted awkwardly.  She went home, rested when she woke up she was having a lot of pain with weightbearing and swelling.  Denies previous injury to ankle/foot.  Denies numbness or tingling.  Denies foot pain.  Patient is active, typically goes for daily runs.  HPI  Past Medical History:  Diagnosis Date  . Asthma   . Hidradenitis suppurativa   . Peanut allergy     Patient Active Problem List   Diagnosis Date Noted  . Peanut allergy 09/30/2013  . Hidradenitis suppurativa 09/30/2013    History reviewed. No pertinent surgical history.  OB History   No obstetric history on file.      Home Medications    Prior to Admission medications   Medication Sig Start Date End Date Taking? Authorizing Provider  albuterol (PROVENTIL HFA;VENTOLIN HFA) 108 (90 Base) MCG/ACT inhaler Use 2 puffs every 4 hours as needed for cough or wheeze.  May use 2 puffs 10-20 minutes prior to exercise. 12/27/15   Baxter Hire, MD  albuterol (PROVENTIL) (2.5 MG/3ML) 0.083% nebulizer solution Take 3 mLs (2.5 mg total) by nebulization every 4 (four) hours as needed for wheezing or shortness of breath. 01/21/16   Baxter Hire, MD  EPINEPHrine 0.3 mg/0.3 mL IJ SOAJ injection Use as directed for a severe allergic reaction. Patient taking differently: Inject 0.3 mg into the skin once as needed (allergic reaction). Use as directed for a severe allergic reaction. 12/27/15   Baxter Hire, MD  fluticasone (FLONASE) 50 MCG/ACT nasal spray Use  1-2 sprays in each nostril once daily for stuffy nose or drainage Patient taking differently: Place 1-2 sprays into both nostrils daily as needed for allergies.  12/27/15   Baxter Hire, MD  ibuprofen (ADVIL) 600 MG tablet Take 1 tablet (600 mg total) by mouth every 6 (six) hours as needed. 07/07/19   Wieters, Hallie C, PA-C  levocetirizine (XYZAL) 5 MG tablet Take one tablet daily for runny nose or itching. Patient taking differently: Take 5 mg by mouth daily as needed for allergies.  12/27/15   Baxter Hire, MD  loratadine (CLARITIN) 10 MG tablet Take 1 tablet (10 mg total) by mouth daily. Patient taking differently: Take 10 mg by mouth daily as needed for allergies.  12/06/15   Baxter Hire, MD  montelukast (SINGULAIR) 10 MG tablet Take one tablet each evening to prevent cough or wheeze. 12/27/15   Baxter Hire, MD  Olopatadine HCl (PATADAY) 0.2 % SOLN Place 1 drop into both eyes 1 day or 1 dose. Patient taking differently: Place 1 drop into both eyes daily as needed (allergies).  12/06/15   Baxter Hire, MD  ondansetron (ZOFRAN) 4 MG tablet Take 1 tablet (4 mg total) by mouth every 8 (eight) hours as needed for nausea or vomiting. 12/26/17   Caccavale, Sophia, PA-C  promethazine (PHENERGAN) 25 MG tablet Take 1 tablet (25 mg total) by mouth  every 8 (eight) hours as needed for nausea or vomiting. 12/26/17   Caccavale, Sophia, PA-C    Family History Family History  Problem Relation Age of Onset  . Diabetes Mother   . Heart disease Mother        hole in her heart  . Diabetes Maternal Grandmother   . Diabetes Maternal Grandfather     Social History Social History   Tobacco Use  . Smoking status: Current Some Day Smoker    Types: Cigars  . Smokeless tobacco: Never Used  Substance Use Topics  . Alcohol use: No  . Drug use: No     Allergies   Lactose intolerance (gi), Peanut-containing drug products, and Latex   Review of Systems Review of Systems  Constitutional:  Negative for fatigue and fever.  Eyes: Negative for visual disturbance.  Respiratory: Negative for shortness of breath.   Cardiovascular: Negative for chest pain.  Gastrointestinal: Negative for abdominal pain, nausea and vomiting.  Musculoskeletal: Positive for arthralgias, gait problem and joint swelling.  Skin: Negative for color change, rash and wound.  Neurological: Negative for dizziness, weakness, light-headedness and headaches.     Physical Exam Triage Vital Signs ED Triage Vitals [07/07/19 1013]  Enc Vitals Group     BP 119/66     Pulse Rate 82     Resp 18     Temp 98.3 F (36.8 C)     Temp Source Oral     SpO2 100 %     Weight      Height      Head Circumference      Peak Flow      Pain Score 8     Pain Loc      Pain Edu?      Excl. in [place]?    No data found.  Updated Vital Signs BP 119/66 (BP Location: Right Arm)   Pulse 82   Temp 98.3 F (36.8 C) (Oral)   Resp 18   LMP 06/16/2019 (Approximate) Comment: denies pregnancy  SpO2 100%   Visual Acuity Right Eye Distance:   Left Eye Distance:   Bilateral Distance:    Right Eye Near:   Left Eye Near:    Bilateral Near:     Physical Exam Vitals signs and nursing note reviewed.  Constitutional:      Appearance: She is well-developed.     Comments: No acute distress  HENT:     Head: Normocephalic and atraumatic.     Nose: Nose normal.  Eyes:     Conjunctiva/sclera: Conjunctivae normal.  Neck:     Musculoskeletal: Neck supple.  Cardiovascular:     Rate and Rhythm: Normal rate.  Pulmonary:     Effort: Pulmonary effort is normal. No respiratory distress.  Abdominal:     General: There is no distension.  Musculoskeletal: Normal range of motion.     Comments: Left ankle/foot: Moderate swelling about lateral malleolus, swelling does not extend into dorsum of foot, tenderness to palpation about entirety of lateral malleolus, mild tenderness palpation over posterior medial malleolus, nontender over  Achilles, negative Thompson's.  Nontender anteriorly and extending into dorsum of foot.  Dorsalis pedis 2+ Sensation intact distally  Skin:    General: Skin is warm and dry.  Neurological:     Mental Status: She is alert and oriented to person, place, and time.      UC Treatments / Results  Labs (all labs ordered are listed, but only abnormal results are displayed) Labs  Reviewed - No data to display  EKG   Radiology Dg Ankle Complete Left  Result Date: 07/07/2019 CLINICAL DATA:  Twist injury getting out of car, pain and swelling at lateral malleolus EXAM: LEFT ANKLE COMPLETE - 3+ VIEW COMPARISON:  None FINDINGS: Lateral soft tissue swelling Osseous mineralization normal. Joint spaces preserved. No acute fracture, dislocation, or bone destruction. IMPRESSION: No acute osseous abnormalities. Electronically Signed   By: Ulyses SouthwardMark  Boles M.D.   On: 07/07/2019 10:50    Procedures Procedures (including critical care time)  Medications Ordered in UC Medications - No data to display  Initial Impression / Assessment and Plan / UC Course  I have reviewed the triage vital signs and the nursing notes.  Pertinent labs & imaging results that were available during my care of the patient were reviewed by me and considered in my medical decision making (see chart for details).     X-ray negative for acute bony abnormality.  Will treat as sprain will provide ASO, crutches, weight-bear as tolerated.  Tylenol and ibuprofen, ice and elevation.Discussed strict return precautions. Patient verbalized understanding and is agreeable with plan.  Final Clinical Impressions(s) / UC Diagnoses   Final diagnoses:  Sprain of left ankle, unspecified ligament, initial encounter     Discharge Instructions     No Fracture Weight bear as tolerated Ice and elevate 2-3 times a day Ease back into running with ankle brace Use anti-inflammatories for pain/swelling. You may take up to 600 mg Ibuprofen every 8  hours with food. You may supplement Ibuprofen with Tylenol [phone] mg every 8 hours.   Follow up if not improving    ED Prescriptions    Medication Sig Dispense Auth. Provider   ibuprofen (ADVIL) 600 MG tablet Take 1 tablet (600 mg total) by mouth every 6 (six) hours as needed. 30 tablet Wieters, MurtaughHallie C, PA-C     PDMP not reviewed this encounter.   Lew DawesWieters, Hallie C, [place]PA-C 07/07/19 1134

## 2019-10-25 ENCOUNTER — Emergency Department (HOSPITAL_COMMUNITY)
Admission: EM | Admit: 2019-10-25 | Discharge: 2019-10-25 | Disposition: A | Discharge status: Home / Self Care | Payer: No Typology Code available for payment source | Attending: Pediatric Emergency Medicine | Admitting: Pediatric Emergency Medicine

## 2019-10-25 ENCOUNTER — Emergency Department (HOSPITAL_COMMUNITY): Payer: No Typology Code available for payment source

## 2019-10-25 ENCOUNTER — Ambulatory Visit (HOSPITAL_COMMUNITY)
Admission: EM | Admit: 2019-10-25 | Discharge: 2019-10-25 | Disposition: A | Discharge status: Home / Self Care | Payer: Managed Care, Other (non HMO)

## 2019-10-25 ENCOUNTER — Encounter (HOSPITAL_COMMUNITY): Payer: Self-pay | Admitting: Emergency Medicine

## 2019-10-25 DIAGNOSIS — S81811A Laceration without foreign body, right lower leg, initial encounter: Secondary | ICD-10-CM | POA: Insufficient documentation

## 2019-10-25 DIAGNOSIS — Y990 Civilian activity done for income or pay: Secondary | ICD-10-CM | POA: Diagnosis not present

## 2019-10-25 DIAGNOSIS — Y9389 Activity, other specified: Secondary | ICD-10-CM | POA: Insufficient documentation

## 2019-10-25 DIAGNOSIS — F17290 Nicotine dependence, other tobacco product, uncomplicated: Secondary | ICD-10-CM | POA: Diagnosis not present

## 2019-10-25 DIAGNOSIS — W228XXA Striking against or struck by other objects, initial encounter: Secondary | ICD-10-CM | POA: Insufficient documentation

## 2019-10-25 DIAGNOSIS — Y929 Unspecified place or not applicable: Secondary | ICD-10-CM | POA: Insufficient documentation

## 2019-10-25 MED ORDER — ACETAMINOPHEN 500 MG PO TABS
1000.0000 mg | ORAL_TABLET | Freq: Once | ORAL | Status: AC
  Administered 2019-10-25: 12:00:00 1000 mg via ORAL
  Filled 2019-10-25: qty 2

## 2019-10-25 MED ORDER — LORAZEPAM 0.5 MG PO TABS
1.0000 mg | ORAL_TABLET | Freq: Once | ORAL | Status: AC
  Administered 2019-10-25: 12:00:00 1 mg via ORAL
  Filled 2019-10-25: qty 2

## 2019-10-25 NOTE — ED Provider Notes (Signed)
[facility] EMERGENCY DEPARTMENT Provider Note   CSN: [id number] Arrival date & time: 10/25/19  1010     History Chief Complaint  Patient presents with  . Laceration    Kerri Gomez is a 24 y.o. female.  HPI     24yo F with leg injury.  Right shin laceration.  Able to ambulate following.  Bleeding controlled with pressure and presents. No other injury.  No LOC.  No fever or other sick symptoms.  Past Medical History:  Diagnosis Date  . Asthma   . Hidradenitis suppurativa   . Peanut allergy     Patient Active Problem List   Diagnosis Date Noted  . Peanut allergy 09/30/2013  . Hidradenitis suppurativa 09/30/2013    History reviewed. No pertinent surgical history.   OB History   No obstetric history on file.     Family History  Problem Relation Age of Onset  . Diabetes Mother   . Heart disease Mother        hole in her heart  . Diabetes Maternal Grandmother   . Diabetes Maternal Grandfather     Social History   Tobacco Use  . Smoking status: Current Some Day Smoker    Types: Cigars  . Smokeless tobacco: Never Used  Substance Use Topics  . Alcohol use: No  . Drug use: No    Home Medications Prior to Admission medications   Medication Sig Start Date End Date Taking? Authorizing Provider  albuterol (PROVENTIL HFA;VENTOLIN HFA) 108 (90 Base) MCG/ACT inhaler Use 2 puffs every 4 hours as needed for cough or wheeze.  May use 2 puffs 10-20 minutes prior to exercise. 12/27/15   Baxter Hire, MD  albuterol (PROVENTIL) (2.5 MG/3ML) 0.083% nebulizer solution Take 3 mLs (2.5 mg total) by nebulization every 4 (four) hours as needed for wheezing or shortness of breath. 01/21/16   Baxter Hire, MD  EPINEPHrine 0.3 mg/0.3 mL IJ SOAJ injection Use as directed for a severe allergic reaction. Patient taking differently: Inject 0.3 mg into the skin once as needed (allergic reaction). Use as directed for a severe allergic reaction. 12/27/15    Baxter Hire, MD  fluticasone (FLONASE) 50 MCG/ACT nasal spray Use 1-2 sprays in each nostril once daily for stuffy nose or drainage Patient taking differently: Place 1-2 sprays into both nostrils daily as needed for allergies.  12/27/15   Baxter Hire, MD  ibuprofen (ADVIL) 600 MG tablet Take 1 tablet (600 mg total) by mouth every 6 (six) hours as needed. 07/07/19   Wieters, Hallie C, PA-C  levocetirizine (XYZAL) 5 MG tablet Take one tablet daily for runny nose or itching. Patient taking differently: Take 5 mg by mouth daily as needed for allergies.  12/27/15   Baxter Hire, MD  loratadine (CLARITIN) 10 MG tablet Take 1 tablet (10 mg total) by mouth daily. Patient taking differently: Take 10 mg by mouth daily as needed for allergies.  12/06/15   Baxter Hire, MD  montelukast (SINGULAIR) 10 MG tablet Take one tablet each evening to prevent cough or wheeze. 12/27/15   Baxter Hire, MD  Olopatadine HCl (PATADAY) 0.2 % SOLN Place 1 drop into both eyes 1 day or 1 dose. Patient taking differently: Place 1 drop into both eyes daily as needed (allergies).  12/06/15   Baxter Hire, MD  ondansetron (ZOFRAN) 4 MG tablet Take 1 tablet (4 mg total) by mouth every 8 (eight) hours as needed for nausea or  vomiting. 12/26/17   Caccavale, Sophia, PA-C  promethazine (PHENERGAN) 25 MG tablet Take 1 tablet (25 mg total) by mouth every 8 (eight) hours as needed for nausea or vomiting. 12/26/17   Caccavale, Sophia, PA-C    Allergies    Lactose intolerance (gi), Peanut-containing drug products, and Latex  Review of Systems   Review of Systems  Constitutional: Negative for activity change, appetite change and fever.  HENT: Negative for congestion and sore throat.   Respiratory: Negative for cough and shortness of breath.   Cardiovascular: Negative for chest pain.  Gastrointestinal: Negative for abdominal pain, diarrhea and vomiting.  Genitourinary: Negative for dysuria.  Musculoskeletal: Negative  for back pain, gait problem and myalgias.  Skin: Positive for wound.  All other systems reviewed and are negative.   Physical Exam Updated Vital Signs BP 129/88 (BP Location: Left Arm)   Pulse 69   Temp 99.1 F (37.3 C) (Oral)   Resp 18   SpO2 100%   Physical Exam Vitals and nursing note reviewed.  Constitutional:      General: She is not in acute distress.    Appearance: She is well-developed.  HENT:     Head: Normocephalic and atraumatic.  Eyes:     Conjunctiva/sclera: Conjunctivae normal.  Cardiovascular:     Rate and Rhythm: Normal rate and regular rhythm.     Heart sounds: No murmur.  Pulmonary:     Effort: Pulmonary effort is normal. No respiratory distress.     Breath sounds: Normal breath sounds.  Abdominal:     Palpations: Abdomen is soft.     Tenderness: There is no abdominal tenderness.  Musculoskeletal:        General: Signs of injury present. No deformity. Normal range of motion.     Cervical back: Neck supple.  Skin:    General: Skin is warm and dry.     Capillary Refill: Capillary refill takes less than 2 seconds.     Comments: 5 cm deep laceration to right shin mid tib-fib  Neurological:     General: No focal deficit present.     Mental Status: She is alert and oriented to person, place, and time. Mental status is at baseline.     Cranial Nerves: No cranial nerve deficit.     Sensory: No sensory deficit.     Motor: No weakness.     Coordination: Coordination normal.     Gait: Gait normal.     Deep Tendon Reflexes: Reflexes normal.     ED Results / Procedures / Treatments   Labs (all labs ordered are listed, but only abnormal results are displayed) Labs Reviewed - No data to display  EKG None  Radiology DG Tibia/Fibula Right  Result Date: 10/25/2019 CLINICAL DATA:  Hit by metal object with open laceration EXAM: RIGHT TIBIA AND FIBULA - 2 VIEW COMPARISON:  None. FINDINGS: Frontal and lateral views obtained. No fracture or dislocation. No  abnormal periosteal reaction. Joint spaces appear unremarkable. No radiopaque foreign body. Soft tissue injury evident mid lower extremity level. IMPRESSION: No radiopaque foreign body. No fracture or dislocation. No appreciable arthropathic change. Electronically Signed   By: Bretta Bang III M.D.   On: 10/25/2019 11:00    Procedures .Marland KitchenLaceration Repair  Date/Time: 10/25/2019 12:08 PM Performed by: Charlett Nose, MD Authorized by: Charlett Nose, MD   Consent:    Consent obtained:  Verbal   Consent given by:  Patient   Risks discussed:  Infection, pain, poor cosmetic result  and poor wound healing   Alternatives discussed:  No treatment Anesthesia (see MAR for exact dosages):    Anesthesia method:  Local infiltration   Local anesthetic:  Lidocaine 2% WITH epi Laceration details:    Location:  Leg   Leg location:  R lower leg   Length (cm):  5   Depth (mm):  10 Repair type:    Repair type:  Intermediate Pre-procedure details:    Preparation:  Patient was prepped and draped in usual sterile fashion and imaging obtained to evaluate for foreign bodies Exploration:    Hemostasis achieved with:  Direct pressure and epinephrine   Wound exploration: wound explored through full range of motion and entire depth of wound probed and visualized     Contaminated: no   Treatment:    Area cleansed with:  Saline   Amount of cleaning:  Standard   Irrigation solution:  Sterile saline Subcutaneous repair:    Suture size:  4-0   Suture material:  Vicryl   Suture technique:  Simple interrupted   Number of sutures:  3 Skin repair:    Repair method:  Sutures   Suture size:  4-0   Suture material:  Chromic gut   Suture technique:  Simple interrupted   Number of sutures:  7 Approximation:    Approximation:  Close Post-procedure details:    Dressing:  Antibiotic ointment   Patient tolerance of procedure:  Tolerated well, no immediate complications   (including critical care  time)  Medications Ordered in ED Medications  LORazepam (ATIVAN) tablet 1 mg (1 mg Oral Given 10/25/19 1133)  acetaminophen (TYLENOL) tablet 1,000 mg (1,000 mg Oral Given 10/25/19 1136)    ED Course  I have reviewed the triage vital signs and the nursing notes.  Pertinent labs & imaging results that were available during my care of the patient were reviewed by me and considered in my medical decision making (see chart for details).    MDM Rules/Calculators/A&P                      24yo F with laceration to shin. No other injuries.  XR without foreign body.  Wound cleaned and closed. Tetanus is reportdely up-to-date per patient. Discussed that sutures should dissolve within 10-14 days and to have them removed if not dissolved within 14 days. Discussed signs infection that warrant reevaluation. Discussed scar minimalization. Will have follow with PCP as needed.  Final Clinical Impression(s) / ED Diagnoses Final diagnoses:  Laceration of right lower extremity, initial encounter    Rx / DC Orders ED Discharge Orders    None       Brent Bulla, MD 10/25/19 1443

## 2019-10-25 NOTE — ED Triage Notes (Signed)
Pt was working at [employer] and hit leg on a "dolly" and has a open laceration to inside of right shin. Bleeding is controlled. Pt was ambulatory to triage. Lac covered with sterile gauze. Pt was sent from UC.

## 2019-10-25 NOTE — ED Notes (Signed)
ED Provider at bedside. Dr Elenor Quinones

## 2019-10-25 NOTE — ED Notes (Signed)
Reviewed dressing changes and wound care, pt states she understands. Supplies sent home with pt

## 2020-11-17 DIAGNOSIS — Z20822 Contact with and (suspected) exposure to covid-19: Secondary | ICD-10-CM | POA: Diagnosis not present

## 2021-03-23 ENCOUNTER — Encounter (HOSPITAL_COMMUNITY): Payer: Self-pay | Admitting: Emergency Medicine

## 2021-03-23 ENCOUNTER — Emergency Department (HOSPITAL_COMMUNITY)
Admission: EM | Admit: 2021-03-23 | Discharge: 2021-03-23 | Discharge status: Against Medical Advice | Payer: Managed Care, Other (non HMO) | Attending: Emergency Medicine | Admitting: Emergency Medicine

## 2021-03-23 ENCOUNTER — Other Ambulatory Visit: Payer: Self-pay

## 2021-03-23 DIAGNOSIS — Z5321 Procedure and treatment not carried out due to patient leaving prior to being seen by health care provider: Secondary | ICD-10-CM | POA: Insufficient documentation

## 2021-03-23 DIAGNOSIS — R112 Nausea with vomiting, unspecified: Secondary | ICD-10-CM | POA: Insufficient documentation

## 2021-03-23 LAB — COMPREHENSIVE METABOLIC PANEL
ALT: 18 U/L (ref 0–44)
AST: 19 U/L (ref 15–41)
Albumin: 4.3 g/dL (ref 3.5–5.0)
Alkaline Phosphatase: 44 U/L (ref 38–126)
Anion gap: 10 (ref 5–15)
BUN: 9 mg/dL (ref 6–20)
CO2: 21 mmol/L — ABNORMAL LOW (ref 22–32)
Calcium: 9.7 mg/dL (ref 8.9–10.3)
Chloride: 106 mmol/L (ref 98–111)
Creatinine, Ser: 0.53 mg/dL (ref 0.44–1.00)
GFR, Estimated: >60 mL/min (ref >60)
Glucose, Bld: 155 mg/dL — ABNORMAL HIGH (ref 70–99)
Potassium: 4.3 mmol/L (ref 3.5–5.1)
Sodium: 137 mmol/L (ref 135–145)
Total Bilirubin: 0.7 mg/dL (ref 0.3–1.2)
Total Protein: 8.5 g/dL — ABNORMAL HIGH (ref 6.5–8.1)

## 2021-03-23 LAB — CBC WITH DIFFERENTIAL/PLATELET
Abs Immature Granulocytes: 0.03 10*3/uL (ref 0.00–0.07)
Basophils Absolute: 0 10*3/uL (ref 0.0–0.1)
Basophils Relative: 0 %
Eosinophils Absolute: 0 10*3/uL (ref 0.0–0.5)
Eosinophils Relative: 0 %
HCT: 39.7 % (ref 36.0–46.0)
Hemoglobin: 12.6 g/dL (ref 12.0–15.0)
Immature Granulocytes: 0 %
Lymphocytes Relative: 8 %
Lymphs Abs: 0.8 10*3/uL (ref 0.7–4.0)
MCH: 31.3 pg (ref 26.0–34.0)
MCHC: 31.7 g/dL (ref 30.0–36.0)
MCV: 98.5 fL (ref 80.0–100.0)
Monocytes Absolute: 0.3 10*3/uL (ref 0.1–1.0)
Monocytes Relative: 3 %
Neutro Abs: 8.7 10*3/uL — ABNORMAL HIGH (ref 1.7–7.7)
Neutrophils Relative %: 89 %
Platelets: 302 10*3/uL (ref 150–400)
RBC: 4.03 MIL/uL (ref 3.87–5.11)
RDW: 12.2 % (ref 11.5–15.5)
WBC: 9.9 10*3/uL (ref 4.0–10.5)
nRBC: 0 % (ref 0.0–0.2)

## 2021-03-23 LAB — I-STAT BETA HCG BLOOD, ED (MC, WL, AP ONLY): I-stat hCG, quantitative: <5 m[IU]/mL (ref <5)

## 2021-03-23 LAB — LIPASE, BLOOD: Lipase: 23 U/L (ref 11–51)

## 2021-03-23 MED ORDER — ONDANSETRON 8 MG PO TBDP
8.0000 mg | ORAL_TABLET | Freq: Once | ORAL | Status: AC
  Administered 2021-03-23: 13:00:00 8 mg via ORAL
  Filled 2021-03-23: qty 1

## 2021-03-23 NOTE — ED Provider Notes (Signed)
Emergency Medicine Provider Triage Evaluation Note  TOCARRA GASSEN , a 25 y.o. female  was evaluated in triage.  Pt complains of chills, abdominal pain, nausea, vomiting. States that she started feeling somewhat off yesterday after eating [employer], but the symptoms started earlier this morning acutely.  She has vomited 3 times this morning but denies any hematemesis.  There is a chance patient could be pregnant.  Review of Systems  Positive: Periumbilical abdominal pain, nausea, vomiting, chills. Negative: Dysuria, hematemesis, bloody stool  Physical Exam  BP (!) 138/93   Pulse 69   Temp 97.9 F (36.6 C) (Oral)   Resp 18   SpO2 100%  Gen:   Awake, no distress  Resp:  Normal effort  MSK:   Moves extremities without difficulty  Other:  She is visibly shivering.  She has abdominal pain mostly localized around the umbilicus.  Medical Decision Making  Medically screening exam initiated at 12:42 PM.  Appropriate orders placed.  LARYN VENNING was informed that the remainder of the evaluation will be completed by another provider, this initial triage assessment does not replace that evaluation, and the importance of remaining in the ED until their evaluation is complete.  Beta hCG, basic abdominal labs ordered.  Giving patient medicine for nausea in triage area   Theron Arista, Cordelia Poche 03/23/21 1246    Mancel Bale, MD 03/23/21 [phone]

## 2021-03-23 NOTE — ED Triage Notes (Signed)
Patient BIB mother, reports N/V with sweats today after eating chipotle last night.

## 2021-03-23 NOTE — ED Notes (Signed)
Per registration, patient turned in labels at 14:06 and reports she is leaving.

## 2021-03-24 ENCOUNTER — Encounter (HOSPITAL_COMMUNITY): Payer: Self-pay | Admitting: Emergency Medicine

## 2021-03-24 ENCOUNTER — Emergency Department (HOSPITAL_COMMUNITY)
Admission: EM | Admit: 2021-03-24 | Discharge: 2021-03-24 | Disposition: A | Discharge status: Home / Self Care | Payer: Medicaid Other | Attending: Emergency Medicine | Admitting: Emergency Medicine

## 2021-03-24 DIAGNOSIS — Z91010 Allergy to peanuts: Secondary | ICD-10-CM | POA: Insufficient documentation

## 2021-03-24 DIAGNOSIS — R6883 Chills (without fever): Secondary | ICD-10-CM | POA: Insufficient documentation

## 2021-03-24 DIAGNOSIS — R112 Nausea with vomiting, unspecified: Secondary | ICD-10-CM | POA: Insufficient documentation

## 2021-03-24 DIAGNOSIS — F17290 Nicotine dependence, other tobacco product, uncomplicated: Secondary | ICD-10-CM | POA: Insufficient documentation

## 2021-03-24 DIAGNOSIS — R109 Unspecified abdominal pain: Secondary | ICD-10-CM | POA: Insufficient documentation

## 2021-03-24 DIAGNOSIS — D72829 Elevated white blood cell count, unspecified: Secondary | ICD-10-CM | POA: Insufficient documentation

## 2021-03-24 DIAGNOSIS — Z79899 Other long term (current) drug therapy: Secondary | ICD-10-CM | POA: Insufficient documentation

## 2021-03-24 DIAGNOSIS — J45909 Unspecified asthma, uncomplicated: Secondary | ICD-10-CM | POA: Insufficient documentation

## 2021-03-24 DIAGNOSIS — R809 Proteinuria, unspecified: Secondary | ICD-10-CM | POA: Insufficient documentation

## 2021-03-24 DIAGNOSIS — Z20822 Contact with and (suspected) exposure to covid-19: Secondary | ICD-10-CM | POA: Insufficient documentation

## 2021-03-24 DIAGNOSIS — Z91040 Latex allergy status: Secondary | ICD-10-CM | POA: Insufficient documentation

## 2021-03-24 LAB — CBC WITH DIFFERENTIAL/PLATELET
Abs Immature Granulocytes: 0.05 10*3/uL (ref 0.00–0.07)
Basophils Absolute: 0 10*3/uL (ref 0.0–0.1)
Basophils Relative: 0 %
Eosinophils Absolute: 0 10*3/uL (ref 0.0–0.5)
Eosinophils Relative: 0 %
HCT: 38.9 % (ref 36.0–46.0)
Hemoglobin: 13 g/dL (ref 12.0–15.0)
Immature Granulocytes: 0 %
Lymphocytes Relative: 6 %
Lymphs Abs: 0.8 10*3/uL (ref 0.7–4.0)
MCH: 31.3 pg (ref 26.0–34.0)
MCHC: 33.4 g/dL (ref 30.0–36.0)
MCV: 93.7 fL (ref 80.0–100.0)
Monocytes Absolute: 0.7 10*3/uL (ref 0.1–1.0)
Monocytes Relative: 5 %
Neutro Abs: 11.2 10*3/uL — ABNORMAL HIGH (ref 1.7–7.7)
Neutrophils Relative %: 89 %
Platelets: 315 10*3/uL (ref 150–400)
RBC: 4.15 MIL/uL (ref 3.87–5.11)
RDW: 12.3 % (ref 11.5–15.5)
WBC: 12.7 10*3/uL — ABNORMAL HIGH (ref 4.0–10.5)
nRBC: 0 % (ref 0.0–0.2)

## 2021-03-24 LAB — COMPREHENSIVE METABOLIC PANEL
ALT: 21 U/L (ref 0–44)
AST: 25 U/L (ref 15–41)
Albumin: 5.1 g/dL — ABNORMAL HIGH (ref 3.5–5.0)
Alkaline Phosphatase: 49 U/L (ref 38–126)
Anion gap: 13 (ref 5–15)
BUN: 13 mg/dL (ref 6–20)
CO2: 20 mmol/L — ABNORMAL LOW (ref 22–32)
Calcium: 10.5 mg/dL — ABNORMAL HIGH (ref 8.9–10.3)
Chloride: 103 mmol/L (ref 98–111)
Creatinine, Ser: 0.66 mg/dL (ref 0.44–1.00)
GFR, Estimated: >60 mL/min (ref >60)
Glucose, Bld: 120 mg/dL — ABNORMAL HIGH (ref 70–99)
Potassium: 3.8 mmol/L (ref 3.5–5.1)
Sodium: 136 mmol/L (ref 135–145)
Total Bilirubin: 0.8 mg/dL (ref 0.3–1.2)
Total Protein: 9.9 g/dL — ABNORMAL HIGH (ref 6.5–8.1)

## 2021-03-24 LAB — URINALYSIS, ROUTINE W REFLEX MICROSCOPIC
Bilirubin Urine: NEGATIVE
Glucose, UA: 50 mg/dL — AB
Hgb urine dipstick: NEGATIVE
Ketones, ur: 80 mg/dL — AB
Nitrite: NEGATIVE
Protein, ur: 30 mg/dL — AB
Specific Gravity, Urine: 1.032 — ABNORMAL HIGH (ref 1.005–1.030)
pH: 5 (ref 5.0–8.0)

## 2021-03-24 LAB — RAPID URINE DRUG SCREEN, HOSP PERFORMED
Amphetamines: NOT DETECTED
Barbiturates: NOT DETECTED
Benzodiazepines: NOT DETECTED
Cocaine: NOT DETECTED
Opiates: NOT DETECTED
Tetrahydrocannabinol: POSITIVE — AB

## 2021-03-24 LAB — I-STAT BETA HCG BLOOD, ED (MC, WL, AP ONLY): I-stat hCG, quantitative: <5 m[IU]/mL (ref <5)

## 2021-03-24 LAB — LIPASE, BLOOD: Lipase: 25 U/L (ref 11–51)

## 2021-03-24 LAB — SARS CORONAVIRUS 2 (TAT 6-24 HRS): SARS Coronavirus 2: NEGATIVE

## 2021-03-24 MED ORDER — KETOROLAC TROMETHAMINE 30 MG/ML IJ SOLN
30.0000 mg | Freq: Once | INTRAMUSCULAR | Status: AC
  Administered 2021-03-24: 30 mg via INTRAVENOUS
  Filled 2021-03-24: qty 1

## 2021-03-24 MED ORDER — METOCLOPRAMIDE HCL 5 MG/ML IJ SOLN
5.0000 mg | Freq: Once | INTRAMUSCULAR | Status: AC
  Administered 2021-03-24: 5 mg via INTRAVENOUS
  Filled 2021-03-24: qty 2

## 2021-03-24 MED ORDER — SODIUM CHLORIDE 0.9 % IV BOLUS
1000.0000 mL | Freq: Once | INTRAVENOUS | Status: AC
  Administered 2021-03-24: 1000 mL via INTRAVENOUS

## 2021-03-24 MED ORDER — ONDANSETRON 4 MG PO TBDP: 4.0000 mg | ORAL_TABLET | Freq: Three times a day (TID) | ORAL | 0 refills | Status: DC | PRN

## 2021-03-24 NOTE — Discharge Instructions (Addendum)
You were evaluated in the Emergency Department and after careful evaluation, we did not find any emergent condition requiring admission or further testing in the hospital.  I do suspect that your symptoms are due to cannabis use.  I encourage you to discontinue the use of marijuana.  Please use the nausea medicine as needed.  Please return to the Emergency Department if you experience any worsening of your condition.  We encourage you to follow up with a primary care provider.  Thank you for allowing [place] to be a part of your care.

## 2021-03-24 NOTE — ED Triage Notes (Signed)
Patient c/o N/V and chills x2 days.

## 2021-03-24 NOTE — ED Provider Notes (Signed)
[facility] Provider Note   CSN: [id number] Arrival date & time: 03/24/21  0654     History Chief Complaint  Patient presents with   Emesis    Kerri Gomez is a 25 y.o. female.  HPI 25 year old female with history of asthma, hydradenitis suppurativa presents to the ER with complaints of nausea and vomiting for the last 2 days.  Patient reports nonbloody nonbilious vomiting with poor p.o. intake.  Has had some chills but no known fevers.  Reports abdominal soreness due to frequent vomiting.  Denies any dysuria or hematuria.  She denies any recent alcohol use, does state that she used cannabis approximately 2 days ago.  Denies any flank pain, pelvic pain, vaginal bleeding or discharge.    Past Medical History:  Diagnosis Date   Asthma    Hidradenitis suppurativa    Peanut allergy     Patient Active Problem List   Diagnosis Date Noted   Peanut allergy 09/30/2013   Hidradenitis suppurativa 09/30/2013    History reviewed. No pertinent surgical history.   OB History   No obstetric history on file.     Family History  Problem Relation Age of Onset   Diabetes Mother    Heart disease Mother        hole in her heart   Diabetes Maternal Grandmother    Diabetes Maternal Grandfather     Social History   Tobacco Use   Smoking status: Some Days    Pack years: 0.00    Types: Cigars   Smokeless tobacco: Never  Substance Use Topics   Alcohol use: No   Drug use: No    Home Medications Prior to Admission medications   Medication Sig Start Date End Date Taking? Authorizing Provider  ondansetron (ZOFRAN ODT) 4 MG disintegrating tablet Take 1 tablet (4 mg total) by mouth every 8 (eight) hours as needed for nausea or vomiting. 03/24/21  Yes Trudee Grip A, PA-C  albuterol (PROVENTIL HFA;VENTOLIN HFA) 108 (90 Base) MCG/ACT inhaler Use 2 puffs every 4 hours as needed for cough or wheeze.  May use 2 puffs 10-20 minutes prior to exercise.  12/27/15   Baxter Hire, MD  albuterol (PROVENTIL) (2.5 MG/3ML) 0.083% nebulizer solution Take 3 mLs (2.5 mg total) by nebulization every 4 (four) hours as needed for wheezing or shortness of breath. 01/21/16   Baxter Hire, MD  EPINEPHrine 0.3 mg/0.3 mL IJ SOAJ injection Use as directed for a severe allergic reaction. Patient taking differently: Inject 0.3 mg into the skin once as needed (allergic reaction). Use as directed for a severe allergic reaction. 12/27/15   Baxter Hire, MD  fluticasone (FLONASE) 50 MCG/ACT nasal spray Use 1-2 sprays in each nostril once daily for stuffy nose or drainage Patient taking differently: Place 1-2 sprays into both nostrils daily as needed for allergies.  12/27/15   Baxter Hire, MD  ibuprofen (ADVIL) 600 MG tablet Take 1 tablet (600 mg total) by mouth every 6 (six) hours as needed. 07/07/19   Wieters, Hallie C, PA-C  levocetirizine (XYZAL) 5 MG tablet Take one tablet daily for runny nose or itching. Patient taking differently: Take 5 mg by mouth daily as needed for allergies.  12/27/15   Baxter Hire, MD  loratadine (CLARITIN) 10 MG tablet Take 1 tablet (10 mg total) by mouth daily. Patient taking differently: Take 10 mg by mouth daily as needed for allergies.  12/06/15   Baxter Hire, MD  montelukast (  SINGULAIR) 10 MG tablet Take one tablet each evening to prevent cough or wheeze. 12/27/15   Baxter Hire, MD  Olopatadine HCl (PATADAY) 0.2 % SOLN Place 1 drop into both eyes 1 day or 1 dose. Patient taking differently: Place 1 drop into both eyes daily as needed (allergies).  12/06/15   Baxter Hire, MD  ondansetron (ZOFRAN) 4 MG tablet Take 1 tablet (4 mg total) by mouth every 8 (eight) hours as needed for nausea or vomiting. 12/26/17   Caccavale, Sophia, PA-C  promethazine (PHENERGAN) 25 MG tablet Take 1 tablet (25 mg total) by mouth every 8 (eight) hours as needed for nausea or vomiting. 12/26/17   Caccavale, Sophia, PA-C    Allergies     Lactose intolerance (gi), Peanut-containing drug products, and Latex  Review of Systems   Review of Systems  Constitutional:  Negative for chills and fever.  HENT:  Negative for ear pain and sore throat.   Eyes:  Negative for pain and visual disturbance.  Respiratory:  Negative for cough and shortness of breath.   Cardiovascular:  Negative for chest pain and palpitations.  Gastrointestinal:  Positive for nausea and vomiting. Negative for abdominal pain.  Genitourinary:  Negative for dysuria and hematuria.  Musculoskeletal:  Negative for arthralgias and back pain.  Skin:  Negative for color change and rash.  Neurological:  Negative for seizures and syncope.  All other systems reviewed and are negative.  Physical Exam Updated Vital Signs BP 133/81   Pulse (!) 56   Temp 98.9 F (37.2 C) (Oral)   Resp 17   SpO2 100%   Physical Exam Vitals reviewed.  Constitutional:      Appearance: Normal appearance. She is not ill-appearing or diaphoretic.  HENT:     Head: Normocephalic and atraumatic.  Eyes:     General:        Right eye: No discharge.        Left eye: No discharge.     Extraocular Movements: Extraocular movements intact.     Conjunctiva/sclera: Conjunctivae normal.  Abdominal:     General: Abdomen is flat.     Tenderness: There is abdominal tenderness. There is no right CVA tenderness or left CVA tenderness.     Comments: Mild epigastric abdominal tenderness  Musculoskeletal:        General: No swelling. Normal range of motion.  Skin:    General: Skin is warm and dry.  Neurological:     General: No focal deficit present.     Mental Status: She is alert and oriented to person, place, and time.     Sensory: No sensory deficit.     Motor: No weakness.  Psychiatric:        Mood and Affect: Mood normal.        Behavior: Behavior normal.    ED Results / Procedures / Treatments   Labs (all labs ordered are listed, but only abnormal results are displayed) Labs  Reviewed  CBC WITH DIFFERENTIAL/PLATELET - Abnormal; Notable for the following components:      Result Value   WBC 12.7 (*)    Neutro Abs 11.2 (*)    All other components within normal limits  COMPREHENSIVE METABOLIC PANEL - Abnormal; Notable for the following components:   CO2 20 (*)    Glucose, Bld 120 (*)    Calcium 10.5 (*)    Total Protein 9.9 (*)    Albumin 5.1 (*)    All other components within normal limits  URINALYSIS, ROUTINE W REFLEX MICROSCOPIC - Abnormal; Notable for the following components:   APPearance HAZY (*)    Specific Gravity, Urine 1.032 (*)    Glucose, UA 50 (*)    Ketones, ur 80 (*)    Protein, ur 30 (*)    Leukocytes,Ua TRACE (*)    Bacteria, UA RARE (*)    All other components within normal limits  RAPID URINE DRUG SCREEN, HOSP PERFORMED - Abnormal; Notable for the following components:   Tetrahydrocannabinol POSITIVE (*)    All other components within normal limits  SARS CORONAVIRUS 2 (TAT 6-24 HRS)  LIPASE, BLOOD  I-STAT BETA HCG BLOOD, ED ([facility], [facility], [facility] ONLY)    EKG None  Radiology No results found.  Procedures Procedures   Medications Ordered in ED Medications  sodium chloride 0.9 % bolus 1,000 mL (0 mLs Intravenous Stopped 03/24/21 0928)  metoCLOPramide (REGLAN) injection 5 mg (5 mg Intravenous Given 03/24/21 0823)  ketorolac (TORADOL) 30 MG/ML injection 30 mg (30 mg Intravenous Given 03/24/21 6433)    ED Course  I have reviewed the triage vital signs and the nursing notes.  Pertinent labs & imaging results that were available during my care of the patient were reviewed by me and considered in my medical decision making (see chart for details).    MDM Rules/Calculators/A&P                         Patient here with 2 days of nausea and vomiting.  On arrival, well-appearing, no acute distress, resting comfortably in the ER bed.  She does have some very mild epigastric tenderness, negative Murphy's, low suspicion for acute abdomen, suspect  soreness from frequent vomitting   CBC with leukocytosis of 12.7, CO2 of 20 likely secondary to vomiting.  UA with ketones, proteinuria, trace leukocytes, patient does not have any urinary symptoms.  UDS positive for THC.  Pregnancy is negative.  Patient was given fluids, Toradol, Reglan with improvement in symptoms.  Tolerated oral fluids well.  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  Patient counseled on cessation of cannabis use.  Will send home with Zofran.  We discussed return precautions.  She was understanding and is agreeable.  Stable for discharge Final Clinical Impression(s) / ED Diagnoses Final diagnoses:  Non-intractable vomiting with nausea, unspecified vomiting type    Rx / DC Orders ED Discharge Orders          Ordered    ondansetron (ZOFRAN ODT) 4 MG disintegrating tablet  Every 8 hours PRN        03/24/21 1016             Leone Brand 03/24/21 1017    Vanetta Mulders, MD 03/27/21 1624
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
## 2022-03-02 ENCOUNTER — Encounter (HOSPITAL_COMMUNITY): Payer: Self-pay

## 2022-03-02 ENCOUNTER — Other Ambulatory Visit: Payer: Self-pay

## 2022-03-02 ENCOUNTER — Emergency Department (HOSPITAL_COMMUNITY)
Admission: EM | Admit: 2022-03-02 | Discharge: 2022-03-02 | Disposition: A | Discharge status: Home / Self Care | Payer: Medicaid Other | Attending: Emergency Medicine | Admitting: Emergency Medicine

## 2022-03-02 DIAGNOSIS — K529 Noninfective gastroenteritis and colitis, unspecified: Secondary | ICD-10-CM | POA: Insufficient documentation

## 2022-03-02 DIAGNOSIS — D72829 Elevated white blood cell count, unspecified: Secondary | ICD-10-CM | POA: Insufficient documentation

## 2022-03-02 DIAGNOSIS — F17290 Nicotine dependence, other tobacco product, uncomplicated: Secondary | ICD-10-CM | POA: Insufficient documentation

## 2022-03-02 DIAGNOSIS — Z91010 Allergy to peanuts: Secondary | ICD-10-CM | POA: Insufficient documentation

## 2022-03-02 DIAGNOSIS — Z91040 Latex allergy status: Secondary | ICD-10-CM | POA: Insufficient documentation

## 2022-03-02 LAB — COMPREHENSIVE METABOLIC PANEL
ALT: 29 U/L (ref 0–44)
AST: 33 U/L (ref 15–41)
Albumin: 4.4 g/dL (ref 3.5–5.0)
Alkaline Phosphatase: 52 U/L (ref 38–126)
Anion gap: 12 (ref 5–15)
BUN: 5 mg/dL — ABNORMAL LOW (ref 6–20)
CO2: 22 mmol/L (ref 22–32)
Calcium: 9.5 mg/dL (ref 8.9–10.3)
Chloride: 108 mmol/L (ref 98–111)
Creatinine, Ser: 0.71 mg/dL (ref 0.44–1.00)
GFR, Estimated: >60 mL/min (ref >60)
Glucose, Bld: 124 mg/dL — ABNORMAL HIGH (ref 70–99)
Potassium: 4.4 mmol/L (ref 3.5–5.1)
Sodium: 142 mmol/L (ref 135–145)
Total Bilirubin: 1.2 mg/dL (ref 0.3–1.2)
Total Protein: 8.5 g/dL — ABNORMAL HIGH (ref 6.5–8.1)

## 2022-03-02 LAB — CBC
HCT: 38.9 % (ref 36.0–46.0)
Hemoglobin: 13.2 g/dL (ref 12.0–15.0)
MCH: 32.6 pg (ref 26.0–34.0)
MCHC: 33.9 g/dL (ref 30.0–36.0)
MCV: 96 fL (ref 80.0–100.0)
Platelets: 295 10*3/uL (ref 150–400)
RBC: 4.05 MIL/uL (ref 3.87–5.11)
RDW: 12.3 % (ref 11.5–15.5)
WBC: 11.6 10*3/uL — ABNORMAL HIGH (ref 4.0–10.5)
nRBC: 0 % (ref 0.0–0.2)

## 2022-03-02 LAB — URINALYSIS, ROUTINE W REFLEX MICROSCOPIC
Bilirubin Urine: NEGATIVE
Glucose, UA: NEGATIVE mg/dL
Hgb urine dipstick: NEGATIVE
Ketones, ur: 20 mg/dL — AB
Leukocytes,Ua: NEGATIVE
Nitrite: NEGATIVE
Protein, ur: NEGATIVE mg/dL
Specific Gravity, Urine: 1.016 (ref 1.005–1.030)
pH: 8 (ref 5.0–8.0)

## 2022-03-02 LAB — I-STAT BETA HCG BLOOD, ED (MC, WL, AP ONLY): I-stat hCG, quantitative: <5 m[IU]/mL (ref <5)

## 2022-03-02 LAB — LIPASE, BLOOD: Lipase: 21 U/L (ref 11–51)

## 2022-03-02 MED ORDER — LIDOCAINE VISCOUS HCL 2 % MT SOLN
15.0000 mL | Freq: Once | OROMUCOSAL | Status: AC
  Administered 2022-03-02: 15 mL via ORAL
  Filled 2022-03-02: qty 15

## 2022-03-02 MED ORDER — LACTATED RINGERS IV BOLUS
1000.0000 mL | Freq: Once | INTRAVENOUS | Status: AC
  Administered 2022-03-02: 1000 mL via INTRAVENOUS

## 2022-03-02 MED ORDER — ONDANSETRON HCL 4 MG PO TABS: 4.0000 mg | ORAL_TABLET | Freq: Four times a day (QID) | ORAL | 0 refills | Status: AC

## 2022-03-02 MED ORDER — ALUM & MAG HYDROXIDE-SIMETH 200-200-20 MG/5ML PO SUSP
30.0000 mL | Freq: Once | ORAL | Status: AC
  Administered 2022-03-02: 30 mL via ORAL
  Filled 2022-03-02: qty 30

## 2022-03-02 MED ORDER — ONDANSETRON HCL 4 MG/2ML IJ SOLN
4.0000 mg | Freq: Once | INTRAMUSCULAR | Status: AC
  Administered 2022-03-02: 4 mg via INTRAVENOUS
  Filled 2022-03-02: qty 2

## 2022-03-02 NOTE — ED Triage Notes (Signed)
Pt c/o N/V, chills started around midnight today. Pt is dry heaving and actively vomiting. Pt is shaking

## 2022-03-02 NOTE — ED Provider Notes (Signed)
[facility] EMERGENCY DEPARTMENT Provider Note   CSN: [id number] Arrival date & time: 03/02/22  1251     History  Chief Complaint  Patient presents with   Emesis    Kerri Gomez is a 26 y.o. female presents to the ED for nausea and vomiting that started around midnight today.  Patient's boyfriend is at bedside and states that she has vomited approximately every 5 minutes since she has been awake.  She did sleep for some time without vomiting, but the second she wakes up she begins vomiting again.  Patient also endorses chills and severe epigastric pain that started after her vomiting.  She denies fevers, diarrhea urinary symptoms.  She has not eaten any new restaurants or had abnormal food ingestions.  No known sick contacts.   Emesis     Home Medications Prior to Admission medications   Medication Sig Start Date End Date Taking? Authorizing Provider  ondansetron (ZOFRAN) 4 MG tablet Take 1 tablet (4 mg total) by mouth every 6 (six) hours for 6 days. 03/02/22 03/08/22 Yes Raynald Blend R, PA-C  albuterol (PROVENTIL HFA;VENTOLIN HFA) 108 (90 Base) MCG/ACT inhaler Use 2 puffs every 4 hours as needed for cough or wheeze.  May use 2 puffs 10-20 minutes prior to exercise. 12/27/15   Baxter Hire, MD  albuterol (PROVENTIL) (2.5 MG/3ML) 0.083% nebulizer solution Take 3 mLs (2.5 mg total) by nebulization every 4 (four) hours as needed for wheezing or shortness of breath. 01/21/16   Baxter Hire, MD  EPINEPHrine 0.3 mg/0.3 mL IJ SOAJ injection Use as directed for a severe allergic reaction. Patient taking differently: Inject 0.3 mg into the skin once as needed (allergic reaction). Use as directed for a severe allergic reaction. 12/27/15   Baxter Hire, MD  fluticasone (FLONASE) 50 MCG/ACT nasal spray Use 1-2 sprays in each nostril once daily for stuffy nose or drainage Patient taking differently: Place 1-2 sprays into both nostrils daily as needed for allergies.   12/27/15   Baxter Hire, MD  ibuprofen (ADVIL) 600 MG tablet Take 1 tablet (600 mg total) by mouth every 6 (six) hours as needed. 07/07/19   Wieters, Hallie C, PA-C  levocetirizine (XYZAL) 5 MG tablet Take one tablet daily for runny nose or itching. Patient taking differently: Take 5 mg by mouth daily as needed for allergies.  12/27/15   Baxter Hire, MD  loratadine (CLARITIN) 10 MG tablet Take 1 tablet (10 mg total) by mouth daily. Patient taking differently: Take 10 mg by mouth daily as needed for allergies.  12/06/15   Baxter Hire, MD  montelukast (SINGULAIR) 10 MG tablet Take one tablet each evening to prevent cough or wheeze. 12/27/15   Baxter Hire, MD  Olopatadine HCl (PATADAY) 0.2 % SOLN Place 1 drop into both eyes 1 day or 1 dose. Patient taking differently: Place 1 drop into both eyes daily as needed (allergies).  12/06/15   Baxter Hire, MD  ondansetron (ZOFRAN ODT) 4 MG disintegrating tablet Take 1 tablet (4 mg total) by mouth every 8 (eight) hours as needed for nausea or vomiting. 03/24/21   Mare Ferrari, PA-C  promethazine (PHENERGAN) 25 MG tablet Take 1 tablet (25 mg total) by mouth every 8 (eight) hours as needed for nausea or vomiting. 12/26/17   Caccavale, Sophia, PA-C      Allergies    Lactose intolerance (gi), Peanut-containing drug products, and Latex    Review of Systems  Review of Systems  Gastrointestinal:  Positive for vomiting.   Physical Exam Updated Vital Signs BP (!) 149/85   Pulse 65   Temp 98.5 F (36.9 C) (Oral)   Resp 18   Ht 5\' 6"  (1.676 m)   Wt 88 kg   SpO2 94%   BMI 31.31 kg/m  Physical Exam Vitals and nursing note reviewed.  Constitutional:      General: She is not in acute distress.    Appearance: She is ill-appearing.     Comments: Patient appears ill, with rigors in bed  HENT:     Head: Atraumatic.  Eyes:     Conjunctiva/sclera: Conjunctivae normal.  Cardiovascular:     Rate and Rhythm: Normal rate and regular rhythm.      Pulses: Normal pulses.     Heart sounds: No murmur heard. Pulmonary:     Effort: Pulmonary effort is normal. No respiratory distress.     Breath sounds: Normal breath sounds.  Abdominal:     General: Abdomen is flat. There is no distension.     Palpations: Abdomen is soft.     Tenderness: There is abdominal tenderness.     Comments: Abdomen is soft, nondistended.  Significant epigastric tenderness to palpation.  Negative Murphy sign, negative McBurney.  Negative CVA tenderness bilaterally  Musculoskeletal:        General: Normal range of motion.     Cervical back: Normal range of motion.  Skin:    General: Skin is warm and dry.     Capillary Refill: Capillary refill takes less than 2 seconds.  Neurological:     General: No focal deficit present.     Mental Status: She is alert.  Psychiatric:        Mood and Affect: Mood normal.    ED Results / Procedures / Treatments   Labs (all labs ordered are listed, but only abnormal results are displayed) Labs Reviewed  COMPREHENSIVE METABOLIC PANEL - Abnormal; Notable for the following components:      Result Value   Glucose, Bld 124 (*)    BUN 5 (*)    Total Protein 8.5 (*)    All other components within normal limits  CBC - Abnormal; Notable for the following components:   WBC 11.6 (*)    All other components within normal limits  URINALYSIS, ROUTINE W REFLEX MICROSCOPIC - Abnormal; Notable for the following components:   Ketones, ur 20 (*)    All other components within normal limits  LIPASE, BLOOD  I-STAT BETA HCG BLOOD, ED ([facility], [facility], [facility] ONLY)    EKG None  Radiology No results found.  Procedures Procedures    Medications Ordered in ED Medications  ondansetron (ZOFRAN) injection 4 mg (4 mg Intravenous Given 03/02/22 1322)  alum & mag hydroxide-simeth (MAALOX/MYLANTA) 200-200-20 MG/5ML suspension 30 mL (30 mLs Oral Given 03/02/22 1416)    And  lidocaine (XYLOCAINE) 2 % viscous mouth solution 15 mL (15 mLs Oral Given  03/02/22 1416)  lactated ringers bolus 1,000 mL (1,000 mLs Intravenous New Bag/Given 03/02/22 1322)    ED Course/ Medical Decision Making/ A&P                           Medical Decision Making Amount and/or Complexity of Data Reviewed Labs: ordered.  Risk OTC drugs. Prescription drug management.   Social determinants of health:  Social History   Socioeconomic History   Marital status: Single    Spouse  name: Not on file   Number of children: Not on file   Years of education: Not on file   Highest education level: Not on file  Occupational History   Not on file  Tobacco Use   Smoking status: Some Days    Types: Cigars   Smokeless tobacco: Never  Substance and Sexual Activity   Alcohol use: No   Drug use: No   Sexual activity: Never    Birth control/protection: Abstinence  Other Topics Concern   Not on file  Social History Narrative   Not on file   Social Determinants of Health   Financial Resource Strain: Not on file  Food Insecurity: Not on file  Transportation Needs: Not on file  Physical Activity: Not on file  Stress: Not on file  Social Connections: Not on file  Intimate Partner Violence: Not on file     Initial impression:  This patient presents to the ED for concern of vomiting and epigastric pain, this involves an extensive number of treatment options, and is a complaint that carries with it a high risk of complications and morbidity.   Differentials include SBO, electrolyte abnormality, acute gastroenteritis, pregnancy  Comorbidities affecting care:  None  Additional history obtained: Boyfriend  Lab Tests  I Ordered, reviewed, and interpreted labs and EKG.  The pertinent results include:  Electrolytes normal UA negative for infection Negative pregnancy Lipase normal CBC with mild leukocytosis at 11.6   Medicines ordered and prescription drug management:  I ordered medication including: GI cocktail Zofran 4 mg Reevaluation of the  patient after these medicines showed that the patient improved I have reviewed the patients home medicines and have made adjustments as needed   ED Course/Re-evaluation: 26 year old female presents to the ED for evaluation of vomiting and epigastric pain that started at midnight last night.  Vitals without significant abnormalities.  On exam, patient has rigors and appears acutely ill.  Abdomen is soft, nondistended and quite tender to palpation epigastrically.  Lungs CTA bilaterally.  Concern for SBO is low given clinical findings.  Patient was given GI cocktail along with fluid bolus and Zofran while pending abdominal labs.  Labs without significant findings although mild leukocytosis. On reevaluation, patient was feeling much better.  Patient was p.o. challenged with ginger ale and was able to keep fluids down.  I did consider obtaining imaging of the abdomen, however given location of pain and positive response to medication, I do not think that this is clinically indicated.  Patient symptoms are consistent with a gastroenteritis.  Discharged home with Zofran.  Return precautions discussed.  Patient expresses understanding and is amenable to plan.  Disposition:  After consideration of the diagnostic results, physical exam, history and the patients response to treatment feel that the patent would benefit from discharge.   Gastroenteritis: Plan and management as described above. Discharged home in good condition. Final Clinical Impression(s) / ED Diagnoses Final diagnoses:  Gastroenteritis    Rx / DC Orders ED Discharge Orders          Ordered    ondansetron (ZOFRAN) 4 MG tablet  Every 6 hours        03/02/22 1458              Janell Quiet, PA-C 03/02/22 1528    Margarita Grizzle, MD 03/03/22 1345

## 2022-03-02 NOTE — Discharge Instructions (Addendum)
Your labs are all normal, and since you had improvement with the GI cocktail and Zofran, I suspect that you have gastritis, or essentially a stomach bug.  I have sent you home with some Zofran that should help keep your nausea at bay.  Take this when you feel nauseous and you should be able to keep food and water down.  This should resolve over the next few days.

## 2023-08-31 ENCOUNTER — Encounter (HOSPITAL_BASED_OUTPATIENT_CLINIC_OR_DEPARTMENT_OTHER): Payer: Self-pay

## 2023-08-31 ENCOUNTER — Other Ambulatory Visit: Payer: Self-pay

## 2023-08-31 ENCOUNTER — Emergency Department (HOSPITAL_BASED_OUTPATIENT_CLINIC_OR_DEPARTMENT_OTHER)
Admission: EM | Admit: 2023-08-31 | Discharge: 2023-08-31 | Disposition: A | Discharge status: Home / Self Care | Payer: Medicaid Other | Attending: Emergency Medicine | Admitting: Emergency Medicine

## 2023-08-31 DIAGNOSIS — Z91010 Allergy to peanuts: Secondary | ICD-10-CM | POA: Insufficient documentation

## 2023-08-31 DIAGNOSIS — R1110 Vomiting, unspecified: Secondary | ICD-10-CM | POA: Diagnosis present

## 2023-08-31 DIAGNOSIS — D72829 Elevated white blood cell count, unspecified: Secondary | ICD-10-CM | POA: Insufficient documentation

## 2023-08-31 DIAGNOSIS — J45909 Unspecified asthma, uncomplicated: Secondary | ICD-10-CM | POA: Insufficient documentation

## 2023-08-31 DIAGNOSIS — K529 Noninfective gastroenteritis and colitis, unspecified: Secondary | ICD-10-CM | POA: Diagnosis not present

## 2023-08-31 DIAGNOSIS — Z91040 Latex allergy status: Secondary | ICD-10-CM | POA: Insufficient documentation

## 2023-08-31 DIAGNOSIS — Z7951 Long term (current) use of inhaled steroids: Secondary | ICD-10-CM | POA: Diagnosis not present

## 2023-08-31 LAB — URINALYSIS, ROUTINE W REFLEX MICROSCOPIC
Bilirubin Urine: NEGATIVE
Glucose, UA: NEGATIVE mg/dL
Hgb urine dipstick: NEGATIVE
Ketones, ur: >80 mg/dL — AB
Nitrite: NEGATIVE
Protein, ur: 30 mg/dL — AB
Specific Gravity, Urine: 1.037 — ABNORMAL HIGH (ref 1.005–1.030)
pH: 6 (ref 5.0–8.0)

## 2023-08-31 LAB — COMPREHENSIVE METABOLIC PANEL
ALT: 14 U/L (ref 0–44)
AST: 16 U/L (ref 15–41)
Albumin: 5 g/dL (ref 3.5–5.0)
Alkaline Phosphatase: 45 U/L (ref 38–126)
Anion gap: 13 (ref 5–15)
BUN: 13 mg/dL (ref 6–20)
CO2: 23 mmol/L (ref 22–32)
Calcium: 10.8 mg/dL — ABNORMAL HIGH (ref 8.9–10.3)
Chloride: 102 mmol/L (ref 98–111)
Creatinine, Ser: 0.75 mg/dL (ref 0.44–1.00)
GFR, Estimated: >60 mL/min (ref >60)
Glucose, Bld: 116 mg/dL — ABNORMAL HIGH (ref 70–99)
Potassium: 3.8 mmol/L (ref 3.5–5.1)
Sodium: 138 mmol/L (ref 135–145)
Total Bilirubin: 1.1 mg/dL (ref <1.2)
Total Protein: 8.8 g/dL — ABNORMAL HIGH (ref 6.5–8.1)

## 2023-08-31 LAB — CBC
HCT: 38.2 % (ref 36.0–46.0)
Hemoglobin: 13.2 g/dL (ref 12.0–15.0)
MCH: 32.6 pg (ref 26.0–34.0)
MCHC: 34.6 g/dL (ref 30.0–36.0)
MCV: 94.3 fL (ref 80.0–100.0)
Platelets: 296 10*3/uL (ref 150–400)
RBC: 4.05 MIL/uL (ref 3.87–5.11)
RDW: 12.1 % (ref 11.5–15.5)
WBC: 11.2 10*3/uL — ABNORMAL HIGH (ref 4.0–10.5)
nRBC: 0 % (ref 0.0–0.2)

## 2023-08-31 LAB — LIPASE, BLOOD: Lipase: <10 U/L — ABNORMAL LOW (ref 11–51)

## 2023-08-31 LAB — PREGNANCY, URINE: Preg Test, Ur: NEGATIVE

## 2023-08-31 MED ORDER — KETOROLAC TROMETHAMINE 30 MG/ML IJ SOLN
30.0000 mg | Freq: Once | INTRAMUSCULAR | Status: AC
  Administered 2023-08-31: 30 mg via INTRAVENOUS
  Filled 2023-08-31: qty 1

## 2023-08-31 MED ORDER — ONDANSETRON HCL 4 MG PO TABS: 4.0000 mg | ORAL_TABLET | Freq: Three times a day (TID) | ORAL | 0 refills | Status: DC | PRN

## 2023-08-31 MED ORDER — ONDANSETRON HCL 4 MG/2ML IJ SOLN
4.0000 mg | Freq: Once | INTRAMUSCULAR | Status: AC
  Administered 2023-08-31: 4 mg via INTRAVENOUS
  Filled 2023-08-31: qty 2

## 2023-08-31 NOTE — ED Triage Notes (Addendum)
In for eval of vomiting onset Saturday evening after attending a baby shower that afternoon. Umbilical abd pain. Denies diarrhea or dysuria. Shaking.
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
## 2023-08-31 NOTE — Discharge Instructions (Addendum)
Today you were seen for nausea and vomiting.  Please pick up your medication and take as needed.  You may also alternate taking Tylenol and Motrin for pain as needed.  Please do not take Motrin for greater than 5 days in a row as this can cause rebound headaches.  Thank you for letting us treat you today. After performing a physical exam and reviewing your labs, I feel you are safe to go home. Please follow up with your PCP in the next several days and provide them with your records from this visit. Return to the Emergency Room if pain becomes severe or symptoms worsen.

## 2023-09-14 ENCOUNTER — Encounter (HOSPITAL_BASED_OUTPATIENT_CLINIC_OR_DEPARTMENT_OTHER): Payer: Self-pay | Admitting: Family Medicine

## 2023-09-14 ENCOUNTER — Other Ambulatory Visit (HOSPITAL_BASED_OUTPATIENT_CLINIC_OR_DEPARTMENT_OTHER): Payer: Self-pay

## 2023-09-14 ENCOUNTER — Ambulatory Visit (HOSPITAL_BASED_OUTPATIENT_CLINIC_OR_DEPARTMENT_OTHER): Payer: Medicaid Other | Admitting: Family Medicine

## 2023-09-14 VITALS — BP 128/78 | HR 91 | Temp 98.4°F | Ht 66.0 in | Wt 147.0 lb

## 2023-09-14 DIAGNOSIS — J4530 Mild persistent asthma, uncomplicated: Secondary | ICD-10-CM | POA: Diagnosis not present

## 2023-09-14 DIAGNOSIS — Z7689 Persons encountering health services in other specified circumstances: Secondary | ICD-10-CM

## 2023-09-14 DIAGNOSIS — M7989 Other specified soft tissue disorders: Secondary | ICD-10-CM

## 2023-09-14 DIAGNOSIS — L732 Hidradenitis suppurativa: Secondary | ICD-10-CM | POA: Diagnosis not present

## 2023-09-14 MED ORDER — EPINEPHRINE 0.3 MG/0.3ML IJ SOAJ
INTRAMUSCULAR | 3 refills | Status: AC
  Filled 2023-09-14: qty 2, 1d supply, fill #0

## 2023-09-14 MED ORDER — CLINDAMYCIN PHOSPHATE 1 % EX GEL
Freq: Two times a day (BID) | CUTANEOUS | 3 refills | Status: DC
  Filled 2023-09-14: qty 30, 30d supply, fill #0

## 2023-09-14 MED ORDER — ALBUTEROL SULFATE HFA 108 (90 BASE) MCG/ACT IN AERS
INHALATION_SPRAY | RESPIRATORY_TRACT | 3 refills | Status: DC
  Filled 2023-09-14: qty 18, 25d supply, fill #0
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
## 2023-09-14 NOTE — Patient Instructions (Addendum)
Please use compression stockings.    Use Hibiclens wash 1-2 x a month to prevent cysts.    Please pick up nebulizer in Pharmacy downstairs.

## 2023-09-29 ENCOUNTER — Other Ambulatory Visit (HOSPITAL_BASED_OUTPATIENT_CLINIC_OR_DEPARTMENT_OTHER): Payer: Self-pay

## 2023-09-29 ENCOUNTER — Emergency Department (HOSPITAL_BASED_OUTPATIENT_CLINIC_OR_DEPARTMENT_OTHER): Payer: Medicaid Other

## 2023-09-29 ENCOUNTER — Encounter (HOSPITAL_BASED_OUTPATIENT_CLINIC_OR_DEPARTMENT_OTHER): Payer: Self-pay | Admitting: Emergency Medicine

## 2023-09-29 ENCOUNTER — Other Ambulatory Visit: Payer: Self-pay

## 2023-09-29 ENCOUNTER — Emergency Department (HOSPITAL_BASED_OUTPATIENT_CLINIC_OR_DEPARTMENT_OTHER)
Admission: EM | Admit: 2023-09-29 | Discharge: 2023-09-29 | Disposition: A | Discharge status: Home / Self Care | Payer: Medicaid Other | Attending: Emergency Medicine | Admitting: Emergency Medicine

## 2023-09-29 DIAGNOSIS — Z20822 Contact with and (suspected) exposure to covid-19: Secondary | ICD-10-CM | POA: Insufficient documentation

## 2023-09-29 DIAGNOSIS — J101 Influenza due to other identified influenza virus with other respiratory manifestations: Secondary | ICD-10-CM | POA: Insufficient documentation

## 2023-09-29 DIAGNOSIS — Z91010 Allergy to peanuts: Secondary | ICD-10-CM | POA: Insufficient documentation

## 2023-09-29 DIAGNOSIS — Z91040 Latex allergy status: Secondary | ICD-10-CM | POA: Insufficient documentation

## 2023-09-29 DIAGNOSIS — J320 Chronic maxillary sinusitis: Secondary | ICD-10-CM | POA: Diagnosis not present

## 2023-09-29 DIAGNOSIS — R059 Cough, unspecified: Secondary | ICD-10-CM | POA: Diagnosis not present

## 2023-09-29 DIAGNOSIS — J029 Acute pharyngitis, unspecified: Secondary | ICD-10-CM | POA: Diagnosis not present

## 2023-09-29 DIAGNOSIS — R600 Localized edema: Secondary | ICD-10-CM | POA: Diagnosis not present

## 2023-09-29 LAB — CBC WITH DIFFERENTIAL/PLATELET
Abs Immature Granulocytes: 0.01 10*3/uL (ref 0.00–0.07)
Basophils Absolute: 0 10*3/uL (ref 0.0–0.1)
Basophils Relative: 0 %
Eosinophils Absolute: 0 10*3/uL (ref 0.0–0.5)
Eosinophils Relative: 0 %
HCT: 37.2 % (ref 36.0–46.0)
Hemoglobin: 12.6 g/dL (ref 12.0–15.0)
Immature Granulocytes: 0 %
Lymphocytes Relative: 23 %
Lymphs Abs: 0.6 10*3/uL — ABNORMAL LOW (ref 0.7–4.0)
MCH: 32 pg (ref 26.0–34.0)
MCHC: 33.9 g/dL (ref 30.0–36.0)
MCV: 94.4 fL (ref 80.0–100.0)
Monocytes Absolute: 0.4 10*3/uL (ref 0.1–1.0)
Monocytes Relative: 14 %
Neutro Abs: 1.6 10*3/uL — ABNORMAL LOW (ref 1.7–7.7)
Neutrophils Relative %: 63 %
Platelets: 125 10*3/uL — ABNORMAL LOW (ref 150–400)
RBC: 3.94 MIL/uL (ref 3.87–5.11)
RDW: 11.8 % (ref 11.5–15.5)
WBC: 2.6 10*3/uL — ABNORMAL LOW (ref 4.0–10.5)
nRBC: 0 % (ref 0.0–0.2)

## 2023-09-29 LAB — URINALYSIS, W/ REFLEX TO CULTURE (INFECTION SUSPECTED)
Bacteria, UA: NONE SEEN
Bilirubin Urine: NEGATIVE
Glucose, UA: NEGATIVE mg/dL
Hgb urine dipstick: NEGATIVE
Ketones, ur: NEGATIVE mg/dL
Leukocytes,Ua: NEGATIVE
Nitrite: NEGATIVE
Protein, ur: NEGATIVE mg/dL
Specific Gravity, Urine: <1.005 — ABNORMAL LOW (ref 1.005–1.030)
pH: 6 (ref 5.0–8.0)

## 2023-09-29 LAB — COMPREHENSIVE METABOLIC PANEL
ALT: 17 U/L (ref 0–44)
AST: 27 U/L (ref 15–41)
Albumin: 4.2 g/dL (ref 3.5–5.0)
Alkaline Phosphatase: 40 U/L (ref 38–126)
Anion gap: 9 (ref 5–15)
BUN: <5 mg/dL — ABNORMAL LOW (ref 6–20)
CO2: 26 mmol/L (ref 22–32)
Calcium: 8.9 mg/dL (ref 8.9–10.3)
Chloride: 102 mmol/L (ref 98–111)
Creatinine, Ser: 0.67 mg/dL (ref 0.44–1.00)
GFR, Estimated: >60 mL/min (ref >60)
Glucose, Bld: 103 mg/dL — ABNORMAL HIGH (ref 70–99)
Potassium: 3.4 mmol/L — ABNORMAL LOW (ref 3.5–5.1)
Sodium: 137 mmol/L (ref 135–145)
Total Bilirubin: 0.8 mg/dL (ref <1.2)
Total Protein: 7.6 g/dL (ref 6.5–8.1)

## 2023-09-29 LAB — RESP PANEL BY RT-PCR (RSV, FLU A&B, COVID)  RVPGX2
Influenza A by PCR: POSITIVE — AB
Influenza B by PCR: NEGATIVE
Resp Syncytial Virus by PCR: NEGATIVE
SARS Coronavirus 2 by RT PCR: NEGATIVE

## 2023-09-29 LAB — GROUP A STREP BY PCR: Group A Strep by PCR: NOT DETECTED

## 2023-09-29 LAB — PREGNANCY, URINE: Preg Test, Ur: NEGATIVE

## 2023-09-29 LAB — LIPASE, BLOOD: Lipase: 20 U/L (ref 11–51)

## 2023-09-29 MED ORDER — ONDANSETRON HCL 4 MG/2ML IJ SOLN
4.0000 mg | Freq: Once | INTRAMUSCULAR | Status: AC
  Administered 2023-09-29: 4 mg via INTRAVENOUS
  Filled 2023-09-29: qty 2

## 2023-09-29 MED ORDER — IOHEXOL 300 MG/ML  SOLN
100.0000 mL | Freq: Once | INTRAMUSCULAR | Status: AC | PRN
  Administered 2023-09-29: 75 mL via INTRAVENOUS

## 2023-09-29 MED ORDER — DEXAMETHASONE SODIUM PHOSPHATE 10 MG/ML IJ SOLN
10.0000 mg | Freq: Once | INTRAMUSCULAR | Status: AC
Start: 2023-09-29 — End: 2023-09-29
  Administered 2023-09-29: 10 mg via INTRAVENOUS
  Filled 2023-09-29: qty 1

## 2023-09-29 MED ORDER — ONDANSETRON 8 MG PO TBDP
8.0000 mg | ORAL_TABLET | Freq: Three times a day (TID) | ORAL | 0 refills | Status: DC | PRN
  Filled 2023-09-29: qty 20, 7d supply, fill #0

## 2023-09-29 MED ORDER — SODIUM CHLORIDE 0.9 % IV BOLUS
1000.0000 mL | Freq: Once | INTRAVENOUS | Status: AC
  Administered 2023-09-29: 1000 mL via INTRAVENOUS

## 2023-09-29 MED ORDER — KETOROLAC TROMETHAMINE 30 MG/ML IJ SOLN
15.0000 mg | Freq: Once | INTRAMUSCULAR | Status: AC
  Administered 2023-09-29: 15 mg via INTRAVENOUS
  Filled 2023-09-29: qty 1

## 2023-09-29 MED ORDER — SODIUM CHLORIDE 0.9 % IV BOLUS: 1000.0000 mL | Freq: Once | INTRAVENOUS | Status: DC

## 2023-09-29 MED ORDER — OXYCODONE-ACETAMINOPHEN 5-325 MG PO TABS
1.0000 | ORAL_TABLET | Freq: Four times a day (QID) | ORAL | 0 refills | Status: DC | PRN
  Filled 2023-09-29: qty 15, 4d supply, fill #0

## 2023-09-29 MED ORDER — MORPHINE SULFATE (PF) 4 MG/ML IV SOLN
4.0000 mg | Freq: Once | INTRAVENOUS | Status: AC
Start: 2023-09-29 — End: 2023-09-29
  Administered 2023-09-29: 4 mg via INTRAVENOUS
  Filled 2023-09-29: qty 1

## 2023-09-29 MED ORDER — SODIUM CHLORIDE 0.9 % IV SOLN: INTRAVENOUS | Status: DC

## 2023-09-29 NOTE — ED Provider Notes (Signed)
[place] EMERGENCY DEPARTMENT AT [facility] Provider Note   CSN: [id number] Arrival date & time: 09/29/23  9323     History  Chief Complaint  Patient presents with   Emesis    FRANCIS POINSETT is a 27 y.o. female.  27 year old female presents with sore throat x 2 weeks.  Seen here few weeks ago and diagnosed with GERD.  States that this is different.  Denies any associated gastric pain.  States it feels like her throat is burning.  No fever or chills.  Not been able to drink liquids.  Denies any urinary symptoms.       Home Medications Prior to Admission medications   Medication Sig Start Date End Date Taking? Authorizing Provider  albuterol (PROVENTIL) (2.5 MG/3ML) 0.083% nebulizer solution Take 3 mLs (2.5 mg total) by nebulization every 4 (four) hours as needed for wheezing or shortness of breath. Patient not taking: Reported on 09/14/2023 01/21/16   Baxter Hire, MD  albuterol (VENTOLIN HFA) 108 (90 Base) MCG/ACT inhaler Use 2 puffs every 4 hours as needed for cough or wheeze.  May use 2 puffs 10-20 minutes prior to exercise. 09/14/23   Caudle, Shelton Silvas, FNP  clindamycin (CLINDAGEL) 1 % gel Apply topically 2 (two) times daily. 09/14/23   Caudle, Shelton Silvas, FNP  EPINEPHrine 0.3 mg/0.3 mL IJ SOAJ injection Use as directed for a severe allergic reaction. 09/14/23   Caudle, Shelton Silvas, FNP  fluticasone (FLONASE) 50 MCG/ACT nasal spray Use 1-2 sprays in each nostril once daily for stuffy nose or drainage Patient not taking: Reported on 09/14/2023 12/27/15   Baxter Hire, MD  montelukast (SINGULAIR) 10 MG tablet Take one tablet each evening to prevent cough or wheeze. Patient not taking: Reported on 09/14/2023 12/27/15   Baxter Hire, MD      Allergies    Other, Latex, Lactose intolerance (gi), and Peanut-containing drug products    Review of Systems   Review of Systems  All other systems reviewed and are negative.   Physical Exam Updated Vital  Signs BP 132/88 (BP Location: Right Arm)   Pulse 82   Temp 98.2 F (36.8 C) (Oral)   Resp 20   LMP 09/21/2023 (Approximate)   SpO2 98%  Physical Exam Vitals and nursing note reviewed.  Constitutional:      General: She is not in acute distress.    Appearance: Normal appearance. She is well-developed. She is not toxic-appearing.  HENT:     Head: Normocephalic and atraumatic.  Eyes:     General: Lids are normal.     Conjunctiva/sclera: Conjunctivae normal.     Pupils: Pupils are equal, round, and reactive to light.  Neck:     Thyroid: No thyroid mass.     Trachea: No tracheal deviation.  Cardiovascular:     Rate and Rhythm: Normal rate and regular rhythm.     Heart sounds: Normal heart sounds. No murmur heard.    No gallop.  Pulmonary:     Effort: Pulmonary effort is normal. No respiratory distress.     Breath sounds: Normal breath sounds. No stridor. No decreased breath sounds, wheezing, rhonchi or rales.  Abdominal:     General: There is no distension.     Palpations: Abdomen is soft.     Tenderness: There is no abdominal tenderness. There is no rebound.  Musculoskeletal:        General: No tenderness. Normal range of motion.     Cervical back: Normal range  of motion and neck supple.  Skin:    General: Skin is warm and dry.     Findings: No abrasion or rash.  Neurological:     Mental Status: She is alert and oriented to person, place, and time. Mental status is at baseline.     GCS: GCS eye subscore is 4. GCS verbal subscore is 5. GCS motor subscore is 6.     Cranial Nerves: No cranial nerve deficit.     Sensory: No sensory deficit.     Motor: Motor function is intact.  Psychiatric:        Attention and Perception: Attention normal.        Speech: Speech normal.        Behavior: Behavior normal.     ED Results / Procedures / Treatments   Labs (all labs ordered are listed, but only abnormal results are displayed) Labs Reviewed  CBC WITH DIFFERENTIAL/PLATELET -  Abnormal; Notable for the following components:      Result Value   WBC 2.6 (*)    Platelets 125 (*)    Neutro Abs 1.6 (*)    Lymphs Abs 0.6 (*)    All other components within normal limits  URINALYSIS, W/ REFLEX TO CULTURE (INFECTION SUSPECTED) - Abnormal; Notable for the following components:   Specific Gravity, Urine <1.005 (*)    All other components within normal limits  PREGNANCY, URINE  COMPREHENSIVE METABOLIC PANEL  LIPASE, BLOOD    EKG None  Radiology No results found.  Procedures Procedures    Medications Ordered in ED Medications  sodium chloride 0.9 % bolus 1,000 mL (has no administration in time range)  morphine (PF) 4 MG/ML injection 4 mg (has no administration in time range)  ondansetron (ZOFRAN) injection 4 mg (has no administration in time range)    ED Course/ Medical Decision Making/ A&P                                 Medical Decision Making Amount and/or Complexity of Data Reviewed Labs: ordered. Radiology: ordered.  Risk Prescription drug management.   Patient here complaining of flulike symptoms along with trouble swallowing.  Strep test was negative.  She is flu a positive.  Given IV fluids along with analgesics and does feel better.  CBC significant for white count likely from viral suppression.  Urinalysis is not evidence of infection.  Chest x-ray per my interpretation shows no acute findings.  CT soft tissue neck shows pharyngitis.  Patient also given additional dose of Decadron.  Patient monitored here and feels better after the treatment.  Will discharge home        Final Clinical Impression(s) / ED Diagnoses Final diagnoses:  None    Rx / DC Orders ED Discharge Orders     None         Lorre Nick, MD 09/29/23 1141

## 2023-09-29 NOTE — ED Notes (Signed)
Discharge paperwork given and verbally understood. 

## 2023-09-29 NOTE — ED Triage Notes (Signed)
Pt caox4 c/o "being sick" for 2 wks with productive cough, sore throat back pain and N/V x2 days. Pt reports she was seen in Nov for similar s/s tx for gastroenteritis, reports s/s are consistent but more severe this time.

## 2023-09-29 NOTE — Discharge Instructions (Addendum)
Use ibuprofen as directed to control your body aches.  Return here for any problems

## 2023-10-02 ENCOUNTER — Telehealth (HOSPITAL_COMMUNITY): Payer: Self-pay

## 2023-10-02 NOTE — Telephone Encounter (Signed)
Attempted to contact the patient on the following dates due to received orders. If no response from patient is obtained by 10/09/23 order to be canceled.  10/02/23 attempt 3 left voice message order to be cx due to 3ed attempt 09/25/23 left voice message 09/18/23 left voice message

## 2023-10-19 ENCOUNTER — Other Ambulatory Visit (HOSPITAL_BASED_OUTPATIENT_CLINIC_OR_DEPARTMENT_OTHER): Payer: Medicaid Other

## 2023-10-26 ENCOUNTER — Encounter (HOSPITAL_BASED_OUTPATIENT_CLINIC_OR_DEPARTMENT_OTHER): Payer: Medicaid Other | Admitting: Family Medicine

## 2023-11-26 ENCOUNTER — Encounter (HOSPITAL_BASED_OUTPATIENT_CLINIC_OR_DEPARTMENT_OTHER): Payer: Medicaid Other | Admitting: Family Medicine

## 2024-05-09 ENCOUNTER — Encounter (HOSPITAL_BASED_OUTPATIENT_CLINIC_OR_DEPARTMENT_OTHER): Payer: Self-pay | Admitting: *Deleted

## 2024-09-18 ENCOUNTER — Encounter (HOSPITAL_BASED_OUTPATIENT_CLINIC_OR_DEPARTMENT_OTHER): Payer: Self-pay | Admitting: Family Medicine

## 2024-09-21 ENCOUNTER — Encounter

## 2024-09-22 ENCOUNTER — Other Ambulatory Visit: Payer: Self-pay

## 2024-09-22 ENCOUNTER — Emergency Department (HOSPITAL_BASED_OUTPATIENT_CLINIC_OR_DEPARTMENT_OTHER)
Admission: EM | Admit: 2024-09-22 | Discharge: 2024-09-23 | Disposition: A | Discharge status: Home / Self Care | Attending: Emergency Medicine | Admitting: Emergency Medicine

## 2024-09-22 DIAGNOSIS — R9431 Abnormal electrocardiogram [ECG] [EKG]: Secondary | ICD-10-CM | POA: Diagnosis not present

## 2024-09-22 DIAGNOSIS — R112 Nausea with vomiting, unspecified: Secondary | ICD-10-CM | POA: Insufficient documentation

## 2024-09-22 DIAGNOSIS — J45909 Unspecified asthma, uncomplicated: Secondary | ICD-10-CM | POA: Insufficient documentation

## 2024-09-22 DIAGNOSIS — F17200 Nicotine dependence, unspecified, uncomplicated: Secondary | ICD-10-CM | POA: Insufficient documentation

## 2024-09-22 DIAGNOSIS — E860 Dehydration: Secondary | ICD-10-CM | POA: Insufficient documentation

## 2024-09-22 DIAGNOSIS — Z91040 Latex allergy status: Secondary | ICD-10-CM | POA: Insufficient documentation

## 2024-09-22 DIAGNOSIS — D72829 Elevated white blood cell count, unspecified: Secondary | ICD-10-CM | POA: Insufficient documentation

## 2024-09-22 DIAGNOSIS — Z91010 Allergy to peanuts: Secondary | ICD-10-CM | POA: Insufficient documentation

## 2024-09-22 DIAGNOSIS — R109 Unspecified abdominal pain: Secondary | ICD-10-CM | POA: Insufficient documentation

## 2024-09-22 LAB — URINALYSIS, ROUTINE W REFLEX MICROSCOPIC
Bilirubin Urine: NEGATIVE
Glucose, UA: NEGATIVE mg/dL
Ketones, ur: >80 mg/dL — AB
Nitrite: NEGATIVE
Protein, ur: 30 mg/dL — AB
Specific Gravity, Urine: 1.04 — ABNORMAL HIGH (ref 1.005–1.030)
Squamous Epithelial / HPF: >50 /HPF (ref 0–5)
WBC, UA: >50 WBC/hpf (ref 0–5)
pH: 6 (ref 5.0–8.0)

## 2024-09-22 LAB — COMPREHENSIVE METABOLIC PANEL WITH GFR
ALT: 17 U/L (ref 0–44)
AST: 22 U/L (ref 15–41)
Albumin: 4.9 g/dL (ref 3.5–5.0)
Alkaline Phosphatase: 69 U/L (ref 38–126)
Anion gap: 16 — ABNORMAL HIGH (ref 5–15)
BUN: 12 mg/dL (ref 6–20)
CO2: 23 mmol/L (ref 22–32)
Calcium: 10.3 mg/dL (ref 8.9–10.3)
Chloride: 103 mmol/L (ref 98–111)
Creatinine, Ser: 0.78 mg/dL (ref 0.44–1.00)
GFR, Estimated: >60 mL/min (ref >60)
Glucose, Bld: 119 mg/dL — ABNORMAL HIGH (ref 70–99)
Potassium: 3.8 mmol/L (ref 3.5–5.1)
Sodium: 142 mmol/L (ref 135–145)
Total Bilirubin: 1 mg/dL (ref 0.0–1.2)
Total Protein: 8.9 g/dL — ABNORMAL HIGH (ref 6.5–8.1)

## 2024-09-22 LAB — CBC
HCT: 38.8 % (ref 36.0–46.0)
Hemoglobin: 13.1 g/dL (ref 12.0–15.0)
MCH: 32 pg (ref 26.0–34.0)
MCHC: 33.8 g/dL (ref 30.0–36.0)
MCV: 94.6 fL (ref 80.0–100.0)
Platelets: 310 K/uL (ref 150–400)
RBC: 4.1 MIL/uL (ref 3.87–5.11)
RDW: 12.4 % (ref 11.5–15.5)
WBC: 11.8 K/uL — ABNORMAL HIGH (ref 4.0–10.5)
nRBC: 0 % (ref 0.0–0.2)

## 2024-09-22 LAB — PREGNANCY, URINE: Preg Test, Ur: NEGATIVE

## 2024-09-22 LAB — LIPASE, BLOOD: Lipase: 13 U/L (ref 11–51)

## 2024-09-22 MED ORDER — LACTATED RINGERS IV BOLUS
1000.0000 mL | Freq: Once | INTRAVENOUS | Status: AC
  Administered 2024-09-22: 1000 mL via INTRAVENOUS

## 2024-09-22 MED ORDER — ONDANSETRON HCL 4 MG/2ML IJ SOLN
4.0000 mg | Freq: Once | INTRAMUSCULAR | Status: AC
  Administered 2024-09-22: 4 mg via INTRAVENOUS
  Filled 2024-09-22: qty 2

## 2024-09-22 MED ORDER — CAPSAICIN 0.075 % EX CREA
TOPICAL_CREAM | Freq: Two times a day (BID) | CUTANEOUS | Status: DC
  Administered 2024-09-22: 1 via TOPICAL
  Filled 2024-09-22: qty 60

## 2024-09-22 MED ORDER — DROPERIDOL 2.5 MG/ML IJ SOLN
1.2500 mg | Freq: Once | INTRAMUSCULAR | Status: AC
  Administered 2024-09-22: 1.25 mg via INTRAVENOUS
  Filled 2024-09-22: qty 2

## 2024-09-22 NOTE — ED Triage Notes (Signed)
 Pt POV reporting persistent n/v and abd pain x2 days.

## 2024-09-23 NOTE — ED Notes (Signed)
 Patient tolerated PO challenge

## 2024-09-23 NOTE — ED Provider Notes (Signed)
 [place] EMERGENCY DEPARTMENT AT [facility] Provider Note   CSN: [id number] Arrival date & time: 09/22/24  2121     Patient presents with: Emesis   Kerri Gomez is a 28 y.o. female.    Emesis  Patient is a 28 year old female with a past medical history send For asthma and at bedtime  She presents emergency room today with complaints of nausea vomiting abdominal pain for the past 2 days per her mother this is a frequent issue for her and she is a daily smoker.  She states that she has not had any fevers no urinary frequency urgency dysuria or hematuria.  No chest pain difficulty breathing.       Prior to Admission medications  Medication Sig Start Date End Date Taking? Authorizing Provider  albuterol  (PROVENTIL ) (2.5 MG/3ML) 0.083% nebulizer solution Take 3 mLs (2.5 mg total) by nebulization every 4 (four) hours as needed for wheezing or shortness of breath. Patient not taking: Reported on 09/14/2023 01/21/16   Hicks, Roselyn M, MD  albuterol  (VENTOLIN  HFA) 108 [phone] Base) MCG/ACT inhaler Use 2 puffs every 4 hours as needed for cough or wheeze.  May use 2 puffs 10-20 minutes prior to exercise. 09/14/23   Caudle, Thersia Bitters, FNP  clindamycin  (CLINDAGEL ) 1 % gel Apply topically 2 (two) times daily. 09/14/23   Caudle, Thersia Bitters, FNP  EPINEPHrine  0.3 mg/0.3 mL IJ SOAJ injection Use as directed for a severe allergic reaction. 09/14/23   Caudle, Thersia Bitters, FNP  fluticasone  (FLONASE ) 50 MCG/ACT nasal spray Use 1-2 sprays in each nostril once daily for stuffy nose or drainage Patient not taking: Reported on 09/14/2023 12/27/15   Hicks, Roselyn M, MD  montelukast  (SINGULAIR ) 10 MG tablet Take one tablet each evening to prevent cough or wheeze. Patient not taking: Reported on 09/14/2023 12/27/15   Hicks, Roselyn M, MD  ondansetron  (ZOFRAN -ODT) 8 MG disintegrating tablet Take 1 tablet (8 mg total) by mouth every 8 (eight) hours as needed for nausea or vomiting. 09/29/23    Dasie Faden, MD  oxyCODONE -acetaminophen  (PERCOCET/ROXICET) 5-325 MG tablet Take 1 tablet by mouth every 6 (six) hours as needed for severe pain (pain score 7-10). 09/29/23   Dasie Faden, MD    Allergies: Other, Latex, Lactose intolerance (gi), and Peanut-containing drug products    Review of Systems  Gastrointestinal:  Positive for vomiting.    Updated Vital Signs BP 116/63 (BP Location: Left Arm)   Pulse (!) 59   Temp 97.9 F (36.6 C) (Oral)   Resp 18   SpO2 100%   Physical Exam Vitals and nursing note reviewed.  Constitutional:      General: She is in acute distress.     Comments: Patient appears to be quite uncomfortable  HENT:     Head: Normocephalic and atraumatic.     Nose: Nose normal.  Eyes:     General: No scleral icterus. Cardiovascular:     Rate and Rhythm: Normal rate and regular rhythm.     Pulses: Normal pulses.     Heart sounds: Normal heart sounds.  Pulmonary:     Effort: Pulmonary effort is normal. No respiratory distress.     Breath sounds: No wheezing.  Abdominal:     Palpations: Abdomen is soft.     Tenderness: There is no abdominal tenderness.     Comments: No tenderness to palpation on exam  Musculoskeletal:     Cervical back: Normal range of motion.     Right lower leg: No  edema.     Left lower leg: No edema.  Skin:    General: Skin is warm and dry.     Capillary Refill: Capillary refill takes less than 2 seconds.  Neurological:     Mental Status: She is alert. Mental status is at baseline.  Psychiatric:        Mood and Affect: Mood normal.        Behavior: Behavior normal.     (all labs ordered are listed, but only abnormal results are displayed) Labs Reviewed  COMPREHENSIVE METABOLIC PANEL WITH GFR - Abnormal; Notable for the following components:      Result Value   Glucose, Bld 119 (*)    Total Protein 8.9 (*)    Anion gap 16 (*)    All other components within normal limits  CBC - Abnormal; Notable for the following  components:   WBC 11.8 (*)    All other components within normal limits  URINALYSIS, ROUTINE W REFLEX MICROSCOPIC - Abnormal; Notable for the following components:   Color, Urine ORANGE (*)    APPearance CLOUDY (*)    Specific Gravity, Urine 1.040 (*)    Hgb urine dipstick LARGE (*)    Ketones, ur >80 (*)    Protein, ur 30 (*)    Leukocytes,Ua LARGE (*)    Bacteria, UA FEW (*)    All other components within normal limits  LIPASE, BLOOD  PREGNANCY, URINE    EKG: EKG Interpretation Date/Time:  Thursday September 22 2024 22:59:01 EST Ventricular Rate:  55 PR Interval:  157 QRS Duration:  99 QT Interval:  438 QTC Calculation: 419 R Axis:   85  Text Interpretation: Sinus rhythm Confirmed by Ruthe Cornet [phone]) on 09/22/2024 11:01:52 PM  Radiology: No results found.   Procedures   Medications Ordered in the ED  lactated ringers  bolus 1,000 mL (0 mLs Intravenous Stopped 09/23/24 0026)  ondansetron  (ZOFRAN ) injection 4 mg (4 mg Intravenous Given 09/22/24 2301)  lactated ringers  bolus 1,000 mL (0 mLs Intravenous Stopped 09/23/24 0026)  droperidol (INAPSINE) 2.5 MG/ML injection 1.25 mg (1.25 mg Intravenous Given 09/22/24 2314)                                    Medical Decision Making Amount and/or Complexity of Data Reviewed Labs: ordered. ECG/medicine tests: ordered.  Risk Prescription drug management.   This patient presents to the ED for concern of abd pain, this involves a number of treatment options, and is a complaint that carries with it a moderate to high risk of complications and morbidity. A differential diagnosis was considered for the patient's symptoms which is discussed below:   The causes of generalized abdominal pain include but are not limited to AAA, mesenteric ischemia, appendicitis, diverticulitis, DKA, gastritis, gastroenteritis, AMI, nephrolithiasis, pancreatitis, peritonitis, adrenal insufficiency,lead poisoning, iron toxicity, intestinal  ischemia, constipation, UTI,SBO/LBO, splenic rupture, biliary disease, IBD, IBS, PUD, or hepatitis. Ectopic pregnancy, ovarian torsion, PID.    Co morbidities: Discussed in HPI   Brief History:  Patient is a 28 year old female with a past medical history send For asthma and at bedtime  She presents emergency room today with complaints of nausea vomiting abdominal pain for the past 2 days per her mother this is a frequent issue for her and she is a daily smoker.  She states that she has not had any fevers no urinary frequency urgency dysuria or hematuria.  No  chest pain difficulty breathing.     EMR reviewed including pt PMHx, past surgical history and past visits to ER.   See HPI for more details   Lab Tests:   I ordered and independently interpreted labs. Labs notable for Nonspecific leukocytosis of 11.8 no anemia, CMP unremarkable lipase normal, urine pregnancy negative, urinalysis consistent with dehydration greater than 50 WBCs and squamous epithelial cells present making urine difficult to interpret.  In the setting of no urinary symptoms we will hold off on empiric treatment.  Imaging Studies:  No imaging studies ordered for this patient    Cardiac Monitoring:  The patient was maintained on a cardiac monitor.  I personally viewed and interpreted the cardiac monitored which showed an underlying rhythm of: NSR EKG non-ischemic   Medicines ordered:  I ordered medication including lactated Ringer 's 2 L, droperidol, Zofran  for nausea vomiting dehydration Reevaluation of the patient after these medicines showed that the patient improved I have reviewed the patients home medicines and have made adjustments as needed   Critical Interventions:     Consults/Attending Physician      Reevaluation:  After the interventions noted above I re-evaluated patient and found that they have :   Social Determinants of Health:      Problem List / ED  Course:  Patient with nausea vomiting and abdominal pain symptoms completely resolved after droperidol.  Also applied capsaicin cream.  On repeat exam patient has no abdominal tenderness no guarding or rebound is well-appearing tolerating p.o. will discharge home return precautions emergency room.   Dispostion:  After consideration of the diagnostic results and the patients response to treatment, I feel that the patent would benefit from close outpatient follow-up.   Final diagnoses:  Nausea and vomiting, unspecified vomiting type    ED Discharge Orders     None          Neldon Hamp RAMAN, [place] 09/23/24 1849    Ruthe Cornet, DO 09/23/24 1919

## 2024-09-23 NOTE — Discharge Instructions (Signed)
 I recommend that you cease using marijuana for 2 months at least and see if this improves your symptoms.   As we discussed, stopping use can take a while to take effect/improve.  I have prescribed you Zofran  for use at home.  You can use the cream that you were given today as well.

## 2024-10-17 ENCOUNTER — Telehealth: Payer: Self-pay

## 2024-10-17 NOTE — Telephone Encounter (Signed)
 Copied from CRM [id number]. Topic: Clinical - Request for Lab/Test Order >> Oct 17, 2024 12:06 PM Tobias CROME wrote: Reason for CRM: patient states her period is 4/5 days late. Was seen in the ER for vomiting and nausea and pregnancy test from then was negative. Patient inquiring if order could be placed to confirm if pregnant or not.   Best callback number: [phone]

## 2024-10-17 NOTE — Telephone Encounter (Unsigned)
 Copied from CRM #[id number]. Topic: Clinical - Request for Lab/Test Order >> Oct 17, 2024  3:26 PM Carlyon D wrote: Reason for CRM: Pt is calling asking if she can have a order entered to check her HCG levels for pregnancy, pt is also scheduled for an appt 01/21. Please call pt when order is entered to get her scheduled.

## 2024-10-17 NOTE — Telephone Encounter (Signed)
 Left detailed message requesting that patient calls office to schedule an appointment.

## 2024-10-18 ENCOUNTER — Other Ambulatory Visit (HOSPITAL_BASED_OUTPATIENT_CLINIC_OR_DEPARTMENT_OTHER): Payer: Self-pay

## 2024-10-18 DIAGNOSIS — N910 Primary amenorrhea: Secondary | ICD-10-CM

## 2024-11-02 ENCOUNTER — Ambulatory Visit (HOSPITAL_BASED_OUTPATIENT_CLINIC_OR_DEPARTMENT_OTHER): Admitting: Family Medicine

## 2024-11-07 ENCOUNTER — Ambulatory Visit: Payer: Self-pay | Admitting: *Deleted

## 2024-11-07 NOTE — Telephone Encounter (Signed)
" °  FYI Only or Action Required?: FYI only for provider: appointment scheduled on 1/28.  Patient was last seen in primary care on 09/14/2023 by Knute Thersia Bitters, FNP.  Called Nurse Triage reporting Hyperemesis Gravidarum.  Symptoms began several days ago.  Interventions attempted: Rest, hydration, or home remedies.  Symptoms are: unchanged.  Triage Disposition: See Physician Within 24 Hours  Patient/caregiver understands and will follow disposition?: Yes   Message from [facility] E sent at 11/07/2024 11:11 AM EST  Summary: 1st pregnancy unable to keep food down nausea all day   Reason for Triage: 1st pregnancy unable to keep food down nausea all day         Reason for Disposition  [1] MODERATE vomiting (e.g., 3 - 5 times / day) AND [2] present > 3 days  Answer Assessment - Initial Assessment Questions 1. SEVERITY - NAUSEA: How bad is the nausea? (e.g., none; mild, moderate, severe)     4 times/day 2. SEVERITY - VOMITING: Are you vomiting? If Yes, ask: How many times have you vomited in the past 24 hours? (e.g., nausea only; mild, moderate or severe vomiting)     5 times within 24 hours 3. ONSET: When did the nausea or vomiting begin?      Patient has hx of stomach issues where she vomits. Patient + pregnancy test- last Wednesday 4. FLUIDS: What fluids or food have you vomited up today? Are you able to keep any liquids down?     Patient is able to keep fluids down -sipping 5. TREATMENT: What have you been doing so far to treat this?      No- worst nausea in morning 6. DEHYDRATION: When was the last time you urinated? Are you feeling lightheaded? Any weight loss?     Patient is urinating normally, no dizziness or weight loss 7. PREGNANCY: How many weeks pregnant are you? How has the pregnancy been going?     LMP 09/16/24 8. EDD: What date are you expecting to deliver?     7.[redacted] weeks pregnant 9. MEDICINES: What medicines are you taking? (e.g., iron,  opioid pain medicines, prenatal vitamins, vitamin B6)     no 10. OTHER SYMPTOMS: Do you have any other symptoms? (e.g., diarrhea, fever)       Diarrhea- 2 days  Protocols used: Pregnancy - Morning Sickness (Nausea and Vomiting of Pregnancy)-A-AH  "

## 2024-11-09 ENCOUNTER — Ambulatory Visit (HOSPITAL_BASED_OUTPATIENT_CLINIC_OR_DEPARTMENT_OTHER): Admitting: Family Medicine

## 2024-11-09 ENCOUNTER — Encounter (HOSPITAL_BASED_OUTPATIENT_CLINIC_OR_DEPARTMENT_OTHER): Payer: Self-pay | Admitting: Family Medicine

## 2024-11-09 ENCOUNTER — Telehealth (HOSPITAL_BASED_OUTPATIENT_CLINIC_OR_DEPARTMENT_OTHER): Payer: Self-pay | Admitting: *Deleted

## 2024-11-09 VITALS — BP 141/83 | HR 75 | Ht 66.0 in | Wt 152.0 lb

## 2024-11-09 DIAGNOSIS — O219 Vomiting of pregnancy, unspecified: Secondary | ICD-10-CM | POA: Diagnosis not present

## 2024-11-09 DIAGNOSIS — Z3A01 Less than 8 weeks gestation of pregnancy: Secondary | ICD-10-CM | POA: Diagnosis not present

## 2024-11-09 DIAGNOSIS — Z3201 Encounter for pregnancy test, result positive: Secondary | ICD-10-CM | POA: Insufficient documentation

## 2024-11-09 DIAGNOSIS — R030 Elevated blood-pressure reading, without diagnosis of hypertension: Secondary | ICD-10-CM

## 2024-11-09 MED ORDER — PYRIDOXINE HCL 25 MG PO TABS: 12.5000 mg | ORAL_TABLET | Freq: Three times a day (TID) | ORAL | 0 refills | Status: AC | PRN

## 2024-11-09 NOTE — Telephone Encounter (Signed)
-----   Message from Thersia Bitters Caudle sent at 11/09/2024  3:00 PM EST ----- Can we have patient return for BP Check?

## 2024-11-09 NOTE — Progress Notes (Signed)
 "    Subjective:   Kerri Gomez [birth year] 11/09/2024  Chief Complaint  Patient presents with   Medical Management of Chronic Issues    Pt states she did a pregnancy test on 1/21, 1/22, and 1/23 and all came back positive. Has been having complaints of nausea. Also states she has been having breast tenderness, frequent urination, and hair shedding.     HPI:  She states she has had 3 positive pregnancy tests starting in December. She is here to confirm pregnancy and obtain OBGYN referral.  Patient's last menstrual period was 09/16/2024. Reports this is her first pregnancy. She does not take any current medications, does not smoke tobacco. She has not been taking a prenatal vitamin. Reports ongoing nausea, vomiting, breast tenderness, and hair thinning.    The following portions of the patient's history were reviewed and updated as appropriate: past medical history, past surgical history, family history, social history, allergies, medications, and problem list.   Patient Active Problem List   Diagnosis Date Noted   Positive urine pregnancy test 11/09/2024   Mild persistent asthma 09/14/2023   Peanut allergy 09/30/2013   Hidradenitis suppurativa 09/30/2013   Past Medical History:  Diagnosis Date   Allergy    Anxiety    Asthma    Hidradenitis suppurativa    Peanut allergy    History reviewed. No pertinent surgical history. Family History  Problem Relation Age of Onset   Diabetes Mother    Heart disease Mother        hole in her heart   Diabetes Maternal Grandmother    Diabetes Maternal Grandfather    Outpatient Medications Prior to Visit  Medication Sig Dispense Refill   EPINEPHrine  0.3 mg/0.3 mL IJ SOAJ injection Use as directed for a severe allergic reaction. 2 each 3   albuterol  (VENTOLIN  HFA) 108 (90 Base) MCG/ACT inhaler Use 2 puffs every 4 hours as needed for cough or wheeze.  May use 2 puffs 10-20 minutes prior to exercise. 18 g 3   clindamycin  (CLINDAGEL ) 1  % gel Apply topically 2 (two) times daily. 30 g 3   ondansetron  (ZOFRAN -ODT) 8 MG disintegrating tablet Take 1 tablet (8 mg total) by mouth every 8 (eight) hours as needed for nausea or vomiting. 20 tablet 0   albuterol  (PROVENTIL ) (2.5 MG/3ML) 0.083% nebulizer solution Take 3 mLs (2.5 mg total) by nebulization every 4 (four) hours as needed for wheezing or shortness of breath. (Patient not taking: Reported on 09/14/2023) 75 mL 1   fluticasone  (FLONASE ) 50 MCG/ACT nasal spray Use 1-2 sprays in each nostril once daily for stuffy nose or drainage (Patient not taking: Reported on 09/14/2023) 16 g 5   montelukast  (SINGULAIR ) 10 MG tablet Take one tablet each evening to prevent cough or wheeze. (Patient not taking: Reported on 09/14/2023) 34 tablet 5   oxyCODONE -acetaminophen  (PERCOCET/ROXICET) 5-325 MG tablet Take 1 tablet by mouth every 6 (six) hours as needed for severe pain (pain score 7-10). (Patient not taking: Reported on 11/09/2024) 15 tablet 0   No facility-administered medications prior to visit.   Allergies[1]   ROS: A complete ROS was performed with pertinent positives/negatives noted in the HPI. The remainder of the ROS are negative.    Objective:   Today's Vitals   11/09/24 1335  BP: (!) 141/83  Pulse: 75  SpO2: 100%  Weight: 152 lb (68.9 kg)  Height: 5' 6 (1.676 m)    Physical Exam   GENERAL: Well-appearing, in NAD. Well nourished.  SKIN:  Pink, warm and dry.  Head: Normocephalic. NECK: Trachea midline. Full ROM w/o pain or tenderness.  RESPIRATORY: Chest wall symmetrical. Respirations even and non-labored. Breath sounds clear to auscultation bilaterally.  CARDIAC: S1, S2 present, regular rate and rhythm without murmur or gallops. Peripheral pulses 2+ bilaterally.  MSK: Muscle tone and strength appropriate for age.  NEUROLOGIC: No motor or sensory deficits. Steady, even gait. C2-C12 intact.  PSYCH/MENTAL STATUS: Alert, oriented x 3. Cooperative, appropriate mood and affect.        Assessment & Plan:  1. Positive urine pregnancy test (Primary) Will confirm with beta HCG lab today. Patient to begin taking OTC Prenatal vitamin and reviewed nutrition and education regarding pregnancy and first trimester. Red flag signs/symptoms reviewed. Referral to establish care with OBGYN placed.  - Beta HCG, Quant - Ambulatory referral to Obstetrics / Gynecology  2. Nausea and vomiting in pregnancy Discussed importance of nutrition in pregnancy and safe use of Vitamin B6 prescribed for patient.   3. Elevated BP reading w/ no diagnosis of HTN Patient vomited due to chronic nausea after BP reading and likely remained elevated. She is asymptomatic at this time and will return for BP check within the week.    Meds ordered this encounter  Medications   pyridOXINE  (VITAMIN B6) 25 MG tablet    Sig: Take 0.5-1 tablets (12.5-25 mg total) by mouth 3 (three) times daily as needed (pregnancy related nausea).    Dispense:  30 tablet    Refill:  0    Supervising Provider:   DE CUBA, RAYMOND J [[id number]]   Lab Orders         Beta HCG, Quant      Return if symptoms worsen or fail to improve.    Patient to reach out to office if new, worrisome, or unresolved symptoms arise or if no improvement in patient's condition. Patient verbalized understanding and is agreeable to treatment plan. All questions answered to patient's satisfaction.    Kerri Schuyler Stark, FNP    [1]  Allergies Allergen Reactions   Other Anaphylaxis   Latex Rash and Swelling   Lactose Intolerance (Gi) Nausea And Vomiting   Peanut-Containing Drug Products Other (See Comments)    Unknown reaction- childhood All Nut allergy   "

## 2024-11-10 ENCOUNTER — Ambulatory Visit (HOSPITAL_BASED_OUTPATIENT_CLINIC_OR_DEPARTMENT_OTHER): Payer: Self-pay | Admitting: Family Medicine

## 2024-11-10 LAB — BETA HCG QUANT (REF LAB): hCG Quant: 99133 m[IU]/mL

## 2024-11-10 NOTE — Progress Notes (Signed)
 Hi Imagine,  Your lab draw does confirm a positive pregnancy and elevation of your HCG level. Please make an appointment to establish care for pregnancy management with OBGYN as discussed and referral placed yesterday for you.

## 2024-11-10 NOTE — Telephone Encounter (Signed)
 Copied from CRM [id number]. Topic: Clinical - Request for Lab/Test Order >> Nov 10, 2024  7:46 AM Berwyn MATSU wrote: Reason for CRM: Patient called in to request an update on results from yesterday.   May you please advise.

## 2024-11-15 ENCOUNTER — Encounter: Payer: Self-pay | Admitting: *Deleted

## 2024-11-16 ENCOUNTER — Ambulatory Visit: Payer: Self-pay | Admitting: *Deleted

## 2024-11-16 ENCOUNTER — Other Ambulatory Visit: Payer: Self-pay

## 2024-11-16 VITALS — BP 127/77 | HR 88 | Wt 156.0 lb

## 2024-11-16 DIAGNOSIS — Z3480 Encounter for supervision of other normal pregnancy, unspecified trimester: Secondary | ICD-10-CM

## 2024-11-16 DIAGNOSIS — O219 Vomiting of pregnancy, unspecified: Secondary | ICD-10-CM

## 2024-11-16 DIAGNOSIS — O3680X0 Pregnancy with inconclusive fetal viability, not applicable or unspecified: Secondary | ICD-10-CM

## 2024-11-16 MED ORDER — BLOOD PRESSURE KIT DEVI: 1.0000 | 0 refills | Status: AC

## 2024-11-16 MED ORDER — PROMETHAZINE HCL 25 MG PO TABS: 25.0000 mg | ORAL_TABLET | Freq: Four times a day (QID) | ORAL | 1 refills | Status: AC | PRN

## 2024-11-16 MED ORDER — PRENATE PIXIE 10-0.6-0.4-200 MG PO CAPS: 1.0000 | ORAL_CAPSULE | Freq: Every day | ORAL | 11 refills | Status: AC

## 2024-11-16 NOTE — Progress Notes (Signed)
 New OB Intake  I connected with Kerri Gomez  on 11/16/24 at  3:10 PM EST by In Person Visit and verified that I am speaking with the correct person using two identifiers. Nurse is located at [facility] and pt is located at [place].  I discussed the limitations, risks, security and privacy concerns of performing an evaluation and management service by telephone and the availability of in person appointments. I also discussed with the patient that there may be a patient responsible charge related to this service. The patient expressed understanding and agreed to proceed.  I explained I am completing New OB Intake today. We discussed EDD of 06/23/25 based on LMP of 09/16/24. Pt is G1P0000. I reviewed her allergies, medications and Medical/Surgical/OB history.    Patient Active Problem List   Diagnosis Date Noted   Supervision of other normal pregnancy, antepartum 11/16/2024   Positive urine pregnancy test 11/09/2024   Mild persistent asthma 09/14/2023   Peanut allergy 09/30/2013   Hidradenitis suppurativa 09/30/2013     Concerns addressed today  Delivery Plans Plans to deliver at [facility]. Discussed the nature of our practice with multiple providers including residents and students as well as female and female providers. Due to the size of the practice, the delivering provider may not be the same as those providing prenatal care.   Patient is not interested in water birth.  MyChart/Babyscripts MyChart access verified. I explained pt will have some visits in office and some virtually. Babyscripts instructions given and order placed. Patient verifies receipt of registration text/e-mail. Account successfully created and app downloaded. If patient is a candidate for Optimized scheduling, add to sticky note.   Blood Pressure Cuff/Weight Scale Blood pressure cuff ordered for patient to pick-up from [employer]. Explained after first prenatal appt pt will check weekly and document in  Babyscripts. Patient does not have weight scale; patient may purchase if they desire to track weight weekly in Babyscripts.  Anatomy US  Explained first scheduled US  will be around 19 weeks. Anatomy US  scheduled for TBD at TBD.  Is patient a candidate for Babyscripts Optimization? Yes, patient accepted    First visit review I reviewed new OB appt with patient. Explained pt will be seen by Dr. Abigail at first visit. Discussed [place] genetic screening with patient. Requests Panorama and Horizon.. Routine prenatal labs OB Urine collected at today's visit. Initial prenatal labs deferred to New OB app.   Last Pap No results found for: DIAGPAP  Rocky CHRISTELLA Ober, RN 11/16/2024  5:14 PM

## 2024-11-16 NOTE — Patient Instructions (Signed)
 [facility] [employer] has a partnership with the [employer] to provide prenatal navigation for the most needed resources in our community. In order to see how we can help connect you to these resources we need consent to contact you. Please complete the very short consent using the link below:   English Link: https://[email or web address].tfaforms.net/283?site=16  Spanish Link: https://[place].tfaforms.net/287?site=16  Options for Doula Care in the [place] Area  As you review your birthing options, consider having a birth doula. A doula is trained to provide support before, during and just after you give birth. There are also postpartum doulas that help you adjust to new parenthood.  While doulas do not provide medical care, they do provide emotional, physical and educational support. A few months before your baby arrives, doulas can help answer questions, ease concerns and help you create and support your birthing plan.    Doulas can help reduce your stress and comfort you and your partner. They can help you cope with labor by helping you use breathing techniques, massage, creative labor positioning, essential oils and affirmations.   Studies show that the benefits of having a doula include:   A more positive birth experience  Fewer requests for pain-relief medication  Less likelihood of cesarean section, commonly called a c-section   Doulas are typically hired via a advertising account planner between you and the doula. We are happy to provide a list of the most active doulas in the area, all of whom are credentialed by [facility] and will not count as a visitor at your birth.  There are several options for no-cost doula care at our hospital, including:  [facility] Volunteer Doula Program [employer] Program (in [place] only) A [employer] Doula Study through [employer]  For more information on these programs or to receive a list of doulas active in our area, please  email [email or web address]

## 2024-11-21 ENCOUNTER — Ambulatory Visit (HOSPITAL_BASED_OUTPATIENT_CLINIC_OR_DEPARTMENT_OTHER): Admitting: Family Medicine

## 2024-12-15 ENCOUNTER — Encounter: Payer: Self-pay | Admitting: Obstetrics and Gynecology

## 2025-01-30 ENCOUNTER — Other Ambulatory Visit
